# Patient Record
Sex: Male | Born: 1945 | Race: White | Hispanic: No | Marital: Married | Smoking: Current every day smoker
Health system: Southern US, Community
[De-identification: ages and names within clinical notes are randomized; demographics above are authoritative.]

## PROBLEM LIST (undated history)

## (undated) DIAGNOSIS — C61 Malignant neoplasm of prostate: Secondary | ICD-10-CM

## (undated) DIAGNOSIS — I739 Peripheral vascular disease, unspecified: Secondary | ICD-10-CM

## (undated) DIAGNOSIS — I10 Essential (primary) hypertension: Secondary | ICD-10-CM

## (undated) DIAGNOSIS — C4490 Unspecified malignant neoplasm of skin, unspecified: Secondary | ICD-10-CM

## (undated) HISTORY — PX: BACK SURGERY: SHX140

## (undated) HISTORY — DX: Malignant neoplasm of prostate: C61

## (undated) HISTORY — PX: MOHS SURGERY: SUR867

## (undated) HISTORY — PX: EYE SURGERY: SHX253

---

## 2004-12-01 ENCOUNTER — Other Ambulatory Visit: Payer: Self-pay

## 2004-12-01 ENCOUNTER — Emergency Department: Payer: Self-pay | Admitting: Emergency Medicine

## 2004-12-12 ENCOUNTER — Ambulatory Visit: Payer: Self-pay | Admitting: Internal Medicine

## 2007-07-17 ENCOUNTER — Ambulatory Visit: Payer: Self-pay | Admitting: Otolaryngology

## 2012-03-25 DIAGNOSIS — M62838 Other muscle spasm: Secondary | ICD-10-CM | POA: Diagnosis not present

## 2012-03-25 DIAGNOSIS — M999 Biomechanical lesion, unspecified: Secondary | ICD-10-CM | POA: Diagnosis not present

## 2012-03-25 DIAGNOSIS — M5030 Other cervical disc degeneration, unspecified cervical region: Secondary | ICD-10-CM | POA: Diagnosis not present

## 2012-03-25 DIAGNOSIS — M9981 Other biomechanical lesions of cervical region: Secondary | ICD-10-CM | POA: Diagnosis not present

## 2012-03-31 DIAGNOSIS — M62838 Other muscle spasm: Secondary | ICD-10-CM | POA: Diagnosis not present

## 2012-03-31 DIAGNOSIS — M999 Biomechanical lesion, unspecified: Secondary | ICD-10-CM | POA: Diagnosis not present

## 2012-03-31 DIAGNOSIS — M9981 Other biomechanical lesions of cervical region: Secondary | ICD-10-CM | POA: Diagnosis not present

## 2012-03-31 DIAGNOSIS — M5030 Other cervical disc degeneration, unspecified cervical region: Secondary | ICD-10-CM | POA: Diagnosis not present

## 2012-04-08 DIAGNOSIS — M999 Biomechanical lesion, unspecified: Secondary | ICD-10-CM | POA: Diagnosis not present

## 2012-04-08 DIAGNOSIS — M9981 Other biomechanical lesions of cervical region: Secondary | ICD-10-CM | POA: Diagnosis not present

## 2012-04-08 DIAGNOSIS — M5030 Other cervical disc degeneration, unspecified cervical region: Secondary | ICD-10-CM | POA: Diagnosis not present

## 2012-04-08 DIAGNOSIS — M62838 Other muscle spasm: Secondary | ICD-10-CM | POA: Diagnosis not present

## 2012-05-06 DIAGNOSIS — M999 Biomechanical lesion, unspecified: Secondary | ICD-10-CM | POA: Diagnosis not present

## 2012-05-06 DIAGNOSIS — M62838 Other muscle spasm: Secondary | ICD-10-CM | POA: Diagnosis not present

## 2012-06-03 DIAGNOSIS — M999 Biomechanical lesion, unspecified: Secondary | ICD-10-CM | POA: Diagnosis not present

## 2012-06-03 DIAGNOSIS — M62838 Other muscle spasm: Secondary | ICD-10-CM | POA: Diagnosis not present

## 2012-07-08 DIAGNOSIS — M999 Biomechanical lesion, unspecified: Secondary | ICD-10-CM | POA: Diagnosis not present

## 2012-07-08 DIAGNOSIS — M62838 Other muscle spasm: Secondary | ICD-10-CM | POA: Diagnosis not present

## 2012-08-12 DIAGNOSIS — M62838 Other muscle spasm: Secondary | ICD-10-CM | POA: Diagnosis not present

## 2012-08-12 DIAGNOSIS — M999 Biomechanical lesion, unspecified: Secondary | ICD-10-CM | POA: Diagnosis not present

## 2012-08-12 DIAGNOSIS — M436 Torticollis: Secondary | ICD-10-CM | POA: Diagnosis not present

## 2012-08-12 DIAGNOSIS — M9981 Other biomechanical lesions of cervical region: Secondary | ICD-10-CM | POA: Diagnosis not present

## 2013-01-31 ENCOUNTER — Ambulatory Visit: Payer: Self-pay | Admitting: Internal Medicine

## 2013-01-31 DIAGNOSIS — M25569 Pain in unspecified knee: Secondary | ICD-10-CM | POA: Diagnosis not present

## 2013-01-31 DIAGNOSIS — M109 Gout, unspecified: Secondary | ICD-10-CM | POA: Diagnosis not present

## 2013-01-31 DIAGNOSIS — M7040 Prepatellar bursitis, unspecified knee: Secondary | ICD-10-CM | POA: Diagnosis not present

## 2013-01-31 DIAGNOSIS — M7989 Other specified soft tissue disorders: Secondary | ICD-10-CM | POA: Diagnosis not present

## 2013-01-31 DIAGNOSIS — M25469 Effusion, unspecified knee: Secondary | ICD-10-CM | POA: Diagnosis not present

## 2013-01-31 LAB — SEDIMENTATION RATE: Erythrocyte Sed Rate: 54 mm/hr — ABNORMAL HIGH (ref 0–20)

## 2013-01-31 LAB — SYNOVIAL CELL COUNT + DIFF, W/ CRYSTALS
Basophil: 0 %
Crystals, Joint Fluid: NONE SEEN
Lymphocytes: 2 %
Nucleated Cell Count: 23114 /mm3
Other Cells BF: 0 %
Other Mononuclear Cells: 0 %

## 2013-01-31 LAB — CBC WITH DIFFERENTIAL/PLATELET
Eosinophil #: 0.1 10*3/uL (ref 0.0–0.7)
HCT: 44.3 % (ref 40.0–52.0)
MCH: 31.9 pg (ref 26.0–34.0)
MCHC: 34.3 g/dL (ref 32.0–36.0)
MCV: 93 fL (ref 80–100)
Monocyte %: 10.8 %
Platelet: 190 10*3/uL (ref 150–440)
RBC: 4.78 10*6/uL (ref 4.40–5.90)
WBC: 13.1 10*3/uL — ABNORMAL HIGH (ref 3.8–10.6)

## 2013-02-04 DIAGNOSIS — M009 Pyogenic arthritis, unspecified: Secondary | ICD-10-CM | POA: Diagnosis not present

## 2013-02-04 DIAGNOSIS — M76899 Other specified enthesopathies of unspecified lower limb, excluding foot: Secondary | ICD-10-CM | POA: Diagnosis not present

## 2013-02-04 DIAGNOSIS — L02419 Cutaneous abscess of limb, unspecified: Secondary | ICD-10-CM | POA: Diagnosis not present

## 2013-02-04 LAB — BODY FLUID CULTURE

## 2013-02-06 DIAGNOSIS — L02419 Cutaneous abscess of limb, unspecified: Secondary | ICD-10-CM | POA: Diagnosis not present

## 2013-02-09 DIAGNOSIS — M76899 Other specified enthesopathies of unspecified lower limb, excluding foot: Secondary | ICD-10-CM | POA: Diagnosis not present

## 2013-02-09 DIAGNOSIS — L02419 Cutaneous abscess of limb, unspecified: Secondary | ICD-10-CM | POA: Diagnosis not present

## 2013-02-13 DIAGNOSIS — M76899 Other specified enthesopathies of unspecified lower limb, excluding foot: Secondary | ICD-10-CM | POA: Diagnosis not present

## 2013-02-13 DIAGNOSIS — L02419 Cutaneous abscess of limb, unspecified: Secondary | ICD-10-CM | POA: Diagnosis not present

## 2013-02-19 DIAGNOSIS — L02419 Cutaneous abscess of limb, unspecified: Secondary | ICD-10-CM | POA: Diagnosis not present

## 2013-04-30 HISTORY — PX: OTHER SURGICAL HISTORY: SHX169

## 2013-06-14 ENCOUNTER — Ambulatory Visit: Payer: Self-pay | Admitting: Family Medicine

## 2013-06-14 DIAGNOSIS — J441 Chronic obstructive pulmonary disease with (acute) exacerbation: Secondary | ICD-10-CM | POA: Diagnosis not present

## 2013-06-14 DIAGNOSIS — J40 Bronchitis, not specified as acute or chronic: Secondary | ICD-10-CM | POA: Diagnosis not present

## 2013-06-14 DIAGNOSIS — J329 Chronic sinusitis, unspecified: Secondary | ICD-10-CM | POA: Diagnosis not present

## 2013-06-14 DIAGNOSIS — J069 Acute upper respiratory infection, unspecified: Secondary | ICD-10-CM | POA: Diagnosis not present

## 2013-06-14 DIAGNOSIS — F17200 Nicotine dependence, unspecified, uncomplicated: Secondary | ICD-10-CM | POA: Diagnosis not present

## 2013-12-16 DIAGNOSIS — D485 Neoplasm of uncertain behavior of skin: Secondary | ICD-10-CM | POA: Diagnosis not present

## 2013-12-16 DIAGNOSIS — L821 Other seborrheic keratosis: Secondary | ICD-10-CM | POA: Diagnosis not present

## 2013-12-16 DIAGNOSIS — C4441 Basal cell carcinoma of skin of scalp and neck: Secondary | ICD-10-CM | POA: Diagnosis not present

## 2013-12-16 DIAGNOSIS — C44319 Basal cell carcinoma of skin of other parts of face: Secondary | ICD-10-CM | POA: Diagnosis not present

## 2013-12-16 DIAGNOSIS — L570 Actinic keratosis: Secondary | ICD-10-CM | POA: Diagnosis not present

## 2013-12-16 DIAGNOSIS — Z1283 Encounter for screening for malignant neoplasm of skin: Secondary | ICD-10-CM | POA: Diagnosis not present

## 2013-12-16 DIAGNOSIS — C44519 Basal cell carcinoma of skin of other part of trunk: Secondary | ICD-10-CM | POA: Diagnosis not present

## 2014-02-10 DIAGNOSIS — C44519 Basal cell carcinoma of skin of other part of trunk: Secondary | ICD-10-CM | POA: Diagnosis not present

## 2014-02-10 DIAGNOSIS — C4441 Basal cell carcinoma of skin of scalp and neck: Secondary | ICD-10-CM | POA: Diagnosis not present

## 2014-02-24 DIAGNOSIS — C44319 Basal cell carcinoma of skin of other parts of face: Secondary | ICD-10-CM | POA: Diagnosis not present

## 2014-02-24 DIAGNOSIS — C44519 Basal cell carcinoma of skin of other part of trunk: Secondary | ICD-10-CM | POA: Diagnosis not present

## 2014-06-22 DIAGNOSIS — L570 Actinic keratosis: Secondary | ICD-10-CM | POA: Diagnosis not present

## 2014-06-22 DIAGNOSIS — Z1283 Encounter for screening for malignant neoplasm of skin: Secondary | ICD-10-CM | POA: Diagnosis not present

## 2014-06-22 DIAGNOSIS — Z85828 Personal history of other malignant neoplasm of skin: Secondary | ICD-10-CM | POA: Diagnosis not present

## 2014-06-22 DIAGNOSIS — D485 Neoplasm of uncertain behavior of skin: Secondary | ICD-10-CM | POA: Diagnosis not present

## 2014-06-22 DIAGNOSIS — Z08 Encounter for follow-up examination after completed treatment for malignant neoplasm: Secondary | ICD-10-CM | POA: Diagnosis not present

## 2014-07-14 ENCOUNTER — Ambulatory Visit: Payer: Self-pay | Admitting: Emergency Medicine

## 2014-07-14 DIAGNOSIS — J329 Chronic sinusitis, unspecified: Secondary | ICD-10-CM | POA: Diagnosis not present

## 2014-07-14 DIAGNOSIS — F17210 Nicotine dependence, cigarettes, uncomplicated: Secondary | ICD-10-CM | POA: Diagnosis not present

## 2014-07-14 DIAGNOSIS — J069 Acute upper respiratory infection, unspecified: Secondary | ICD-10-CM | POA: Diagnosis not present

## 2014-09-14 DIAGNOSIS — L570 Actinic keratosis: Secondary | ICD-10-CM | POA: Diagnosis not present

## 2014-09-14 DIAGNOSIS — D485 Neoplasm of uncertain behavior of skin: Secondary | ICD-10-CM | POA: Diagnosis not present

## 2014-09-15 DIAGNOSIS — D0339 Melanoma in situ of other parts of face: Secondary | ICD-10-CM | POA: Diagnosis not present

## 2014-10-19 DIAGNOSIS — R238 Other skin changes: Secondary | ICD-10-CM | POA: Diagnosis not present

## 2014-10-19 DIAGNOSIS — D0339 Melanoma in situ of other parts of face: Secondary | ICD-10-CM | POA: Diagnosis not present

## 2014-10-19 DIAGNOSIS — Z85820 Personal history of malignant melanoma of skin: Secondary | ICD-10-CM | POA: Insufficient documentation

## 2015-02-22 ENCOUNTER — Emergency Department
Admission: EM | Admit: 2015-02-22 | Discharge: 2015-02-22 | Disposition: A | Discharge status: Home / Self Care | Payer: Worker's Compensation | Attending: Emergency Medicine | Admitting: Emergency Medicine

## 2015-02-22 ENCOUNTER — Emergency Department: Payer: Worker's Compensation

## 2015-02-22 ENCOUNTER — Encounter: Payer: Self-pay | Admitting: Emergency Medicine

## 2015-02-22 ENCOUNTER — Encounter: Payer: Self-pay | Admitting: Intensive Care

## 2015-02-22 ENCOUNTER — Ambulatory Visit
Admission: EM | Admit: 2015-02-22 | Discharge: 2015-02-22 | Disposition: A | Discharge status: Home / Self Care | Payer: Worker's Compensation | Attending: Family Medicine | Admitting: Family Medicine

## 2015-02-22 DIAGNOSIS — W19XXXA Unspecified fall, initial encounter: Secondary | ICD-10-CM | POA: Diagnosis not present

## 2015-02-22 DIAGNOSIS — Y990 Civilian activity done for income or pay: Secondary | ICD-10-CM | POA: Diagnosis not present

## 2015-02-22 DIAGNOSIS — M79604 Pain in right leg: Secondary | ICD-10-CM | POA: Diagnosis not present

## 2015-02-22 DIAGNOSIS — Y9289 Other specified places as the place of occurrence of the external cause: Secondary | ICD-10-CM | POA: Insufficient documentation

## 2015-02-22 DIAGNOSIS — S81811A Laceration without foreign body, right lower leg, initial encounter: Secondary | ICD-10-CM | POA: Insufficient documentation

## 2015-02-22 DIAGNOSIS — W1789XA Other fall from one level to another, initial encounter: Secondary | ICD-10-CM | POA: Insufficient documentation

## 2015-02-22 DIAGNOSIS — S0003XA Contusion of scalp, initial encounter: Secondary | ICD-10-CM | POA: Diagnosis not present

## 2015-02-22 DIAGNOSIS — Y9389 Activity, other specified: Secondary | ICD-10-CM | POA: Diagnosis not present

## 2015-02-22 DIAGNOSIS — Z720 Tobacco use: Secondary | ICD-10-CM | POA: Diagnosis not present

## 2015-02-22 DIAGNOSIS — IMO0002 Reserved for concepts with insufficient information to code with codable children: Secondary | ICD-10-CM

## 2015-02-22 DIAGNOSIS — Z23 Encounter for immunization: Secondary | ICD-10-CM | POA: Insufficient documentation

## 2015-02-22 HISTORY — DX: Unspecified malignant neoplasm of skin, unspecified: C44.90

## 2015-02-22 MED ORDER — CEPHALEXIN 250 MG PO CAPS: 250.0000 mg | ORAL_CAPSULE | Freq: Four times a day (QID) | ORAL | Status: AC

## 2015-02-22 MED ORDER — TETANUS-DIPHTH-ACELL PERTUSSIS 5-2.5-18.5 LF-MCG/0.5 IM SUSP
0.5000 mL | Freq: Once | INTRAMUSCULAR | Status: AC
  Administered 2015-02-22: 0.5 mL via INTRAMUSCULAR
  Filled 2015-02-22: qty 0.5

## 2015-02-22 MED ORDER — LIDOCAINE-EPINEPHRINE (PF) 1 %-1:200000 IJ SOLN
INTRAMUSCULAR | Status: AC
  Administered 2015-02-22: 30 mL
  Filled 2015-02-22: qty 30

## 2015-02-22 MED ORDER — OXYCODONE-ACETAMINOPHEN 5-325 MG PO TABS: 2.0000 | ORAL_TABLET | Freq: Four times a day (QID) | ORAL | Status: DC | PRN

## 2015-02-22 NOTE — ED Notes (Signed)
EMS present to transport pt. Pt remains stable, alert and talkative. Wife present, aware pt is going to [facility].

## 2015-02-22 NOTE — ED Provider Notes (Signed)
[facility] Emergency Department Provider Note     Time seen: ----------------------------------------- 9:25 AM on 02/22/2015 -----------------------------------------    I have reviewed the triage vital signs and the nursing notes.   HISTORY  Chief Complaint Fall    HPI Howard Stafford is a 69 y.o. male who presents ER arriving by EMS from work. Patient fell 6 feet from some scaffolding. He was noted to have extensive lacerations and tissue damage to his right leg. He was sent to ER for excessive bleeding at the wound site. He also hit his head, but denies loss of consciousness. He complains of mild pain in the right lower extremity about 3 of 10. Nothing makes it better or worse.   Past Medical History  Diagnosis Date  . Skin cancer face    There are no active problems to display for this patient.   Past Surgical History  Procedure Laterality Date  . Back surgery      Allergies Review of patient's allergies indicates no known allergies.  Social History Social History  Substance Use Topics  . Smoking status: Current Every Day Smoker    Types: Cigarettes  . Smokeless tobacco: Never Used  . Alcohol Use: Yes     Comment: Everynight [name]    Review of Systems Constitutional: Negative for fever. Genitourinary: Negative for dysuria. Musculoskeletal: Positive for right lower extremity pain Skin: Positive for extensive right lower extremity laceration Neurological: Negative for headaches, focal weakness or numbness.  10-point ROS otherwise negative.  ____________________________________________   PHYSICAL EXAM:  VITAL SIGNS: ED Triage Vitals  Enc Vitals Group     BP 02/22/15 0857 159/100 mmHg     Pulse Rate 02/22/15 0857 70     Resp 02/22/15 0859 20     Temp 02/22/15 0859 98.1 F (36.7 C)     Temp Source 02/22/15 0859 Oral     SpO2 02/22/15 0857 96 %     Weight 02/22/15 0900 195 lb (88.451 kg)     Height 02/22/15  0900 5\' 10"  (1.778 m)     Head Cir --      Peak Flow --      Pain Score 02/22/15 0901 3     Pain Loc --      Pain Edu? --      Excl. in [place]? --     Constitutional: Alert and oriented. Well appearing and in no distress. Eyes: Conjunctivae are normal. PERRL. Normal extraocular movements. ENT   Head: Posterior scalp contusion   Nose: No congestion/rhinnorhea.   Mouth/Throat: Mucous membranes are moist.   Neck: No stridor. Musculoskeletal: Extensive right lower extremity lacerations, soft tissue injury Neurologic:  Normal speech and language. No gross focal neurologic deficits are appreciated. Speech is normal. No gait instability. Normal sensation and motor function the right lower extremity Skin:  Two large V-shaped lacerations below the knee on the right lower extremity Psychiatric: Mood and affect are normal. Speech and behavior are normal. Patient exhibits appropriate insight and judgment. ____________________________________________  ED COURSE:  Pertinent labs & imaging results that were available during my care of the patient were reviewed by me and considered in my medical decision making (see chart for details). Patient receive imaging of the right lower extremity as well as CT head. Patient has extensive lacerations that require repair  LACERATION REPAIR Performed by: Earleen Newport Authorized by: Lenise Arena E Consent: Verbal consent obtained. Risks and benefits: risks, benefits and alternatives were discussed Consent given by:  patient Patient identity confirmed: provided demographic data Prepped and Draped in normal sterile fashion Wound explored without foreign body identified  Laceration Location: Right lower extremity  Laceration Length: 25 cm   No Foreign Bodies seen or palpated  Anesthesia: local infiltration  Local anesthetic: lidocaine 1 % with epinephrine  Anesthetic total: 10 ml  Irrigation method: syringe Amount of cleaning:  standard  Skin closure: The lateral aspect of both V-shaped wounds were closed with a horizontal 3-0 Ethilon mattress stitch. Making a total of 4 of these sutures. The tip of the lacerations was also closed with 3-0 Ethilon. The remainder of both wounds were closed with staples. The superior wound was closed with 15 staples, the inferior wound was closed with 10 staples Number of sutures: 6   Technique: Mattress stitch, standard interrupted   Patient tolerance: Patient tolerated the procedure well with no immediate complications. ____________________________________________   RADIOLOGY Images were viewed by me  Patient declined CT imaging of the head, tib-fib x-rays are unremarkable except for soft tissue injury  ____________________________________________  FINAL ASSESSMENT AND PLAN  Fall, lacerations  Plan: Patient with labs and imaging as dictated above. Wounds were repaired as dictated above. Patient will follow-up in 1-2 days for recheck. Will discharge with Keflex and pain medication. Patient did receive a TDAP here prior to discharge   Earleen Newport, MD   Earleen Newport, MD 02/22/15 1020

## 2015-02-22 NOTE — ED Notes (Signed)
Patient states that he fell at work 6 ft.  Patient has deep laceration to right leg.

## 2015-02-22 NOTE — ED Notes (Signed)
Patient presents ambulatory to front desk with blood soaked towel wrapped around his right leg and secured with electrical tape. Pt's boot and and jeans saturated with blood. Pt verbalized "I fell from a 78ft scaffold and lacerated my leg. I have some bleeding from the back of my head, and from the back of my hand." Pt denies LOC. Assisted to exam room. Jeans cut off, towel removed and pressure dressing applied. Provider at bedside. EMS called for transport as laceration to RLE exposes bone. Vital signs stable.

## 2015-02-22 NOTE — ED Notes (Addendum)
PAtient arrived by EMS from work. PAtient fell 6 ft while scaffolding. R shin has two lacerations. Blood drainage noted

## 2015-02-22 NOTE — ED Provider Notes (Addendum)
CSN: [id number]     Arrival date & time 02/22/15  0810 History   First MD Initiated Contact with Patient 02/22/15 [phone]     Chief Complaint  Patient presents with  . Extremity Laceration  . Fall   (Consider location/radiation/quality/duration/timing/severity/associated sxs/prior Treatment) HPI  This a 69 year old male presents with a right lower leg lacerations that occurred when he was at work and fell 6 feet from scaffolding. He has no loss of consciousness and denies any visual disturbances weakness numbness or tingling. His lower leg is wrapped in a towel that is soaked with blood. In addition he has a skin tear over his right hand dorsum and an abrasion on the back of his head with no apparent laceration. Is no cervical or spinal tenderness. Is brought in by his coworkers.       History reviewed. No pertinent past medical history. History reviewed. No pertinent past surgical history. History reviewed. No pertinent family history. Social History  Substance Use Topics  . Smoking status: Current Every Day Smoker -- 1.00 packs/day    Types: Cigarettes  . Smokeless tobacco: None  . Alcohol Use: Yes    Review of Systems  Constitutional: Negative for fever, chills, diaphoresis and fatigue.  HENT: Negative for ear discharge, ear pain, facial swelling and hearing loss.   Eyes: Negative for photophobia and pain.  Respiratory: Negative for cough.   Neurological: Negative for dizziness, facial asymmetry, speech difficulty, weakness, numbness and headaches.  All other systems reviewed and are negative.   Allergies  Review of patient's allergies indicates no known allergies.  Home Medications   Prior to Admission medications   Medication Sig Start Date End Date Taking? Authorizing Provider  aspirin 325 MG EC tablet Take 325 mg by mouth daily.   Yes Historical Provider, MD   Meds Ordered and Administered this Visit  Medications - No data to display  BP 185/89 mmHg  Pulse 74   Resp 17  SpO2 99% No data found.   Physical Exam  Constitutional: He is oriented to person, place, and time. He appears well-developed and well-nourished.  HENT:  Head: Normocephalic.  Semination of his hospitalization shows a abrasion to 3 cm in length on the midline occipital region. No pain with cervical motion. No tenderness to palpation.  Neck: Neck supple.  Musculoskeletal:  Examination of the right lower extremity shows 2 flap type lacerations over the anterior tibia; distal one is the most severe with exposed. periostium. The more proximal laceration has some mild oozing bleeding but no active bleeding is seen from either laceration. Distal pulses are intact both the dorsalis pedis and posterior tibialis.Distal sensation is intact to light touch. Motors are intact and strong. There is no obvious deformity present. There is a skin tear over the right dorsum hand.  Neurological: He is alert and oriented to person, place, and time. He exhibits normal muscle tone. Coordination normal.  Skin: Skin is warm and dry.  Psychiatric: He has a normal mood and affect. His behavior is normal. Judgment and thought content normal.  Nursing note and vitals reviewed.   ED Course  Procedures (including critical care time)  Labs Review Labs Reviewed - No data to display  Imaging Review No results found.   Visual Acuity Review  Right Eye Distance:   Left Eye Distance:   Bilateral Distance:    Right Eye Near:   Left Eye Near:    Bilateral Near:         MDM  1. Lacerations of multiple sites of right leg, initial encounter    The bloody wraps were removed the wound evaluated and new dressings of dry sterile dressings applied. A IV was placed with Hep-Lock in the  left upper extremity. Patient was stable and will be transported to [facility] emergency department for further evaluation and care. Colletta Maryland Camera operator at [facility] was notified.    Lorin Picket, PA-C 02/22/15  0859  Lorin Picket, PA-C 02/22/15 [phone]

## 2015-02-22 NOTE — Discharge Instructions (Signed)

## 2015-02-23 ENCOUNTER — Emergency Department
Admission: EM | Admit: 2015-02-23 | Discharge: 2015-02-23 | Disposition: A | Discharge status: Home / Self Care | Payer: Worker's Compensation | Attending: Emergency Medicine | Admitting: Emergency Medicine

## 2015-02-23 ENCOUNTER — Encounter: Payer: Self-pay | Admitting: Intensive Care

## 2015-02-23 ENCOUNTER — Telehealth: Payer: Self-pay

## 2015-02-23 DIAGNOSIS — Z4801 Encounter for change or removal of surgical wound dressing: Secondary | ICD-10-CM | POA: Insufficient documentation

## 2015-02-23 DIAGNOSIS — M791 Myalgia: Secondary | ICD-10-CM | POA: Diagnosis not present

## 2015-02-23 DIAGNOSIS — Z720 Tobacco use: Secondary | ICD-10-CM | POA: Insufficient documentation

## 2015-02-23 DIAGNOSIS — Z7982 Long term (current) use of aspirin: Secondary | ICD-10-CM | POA: Insufficient documentation

## 2015-02-23 DIAGNOSIS — Z792 Long term (current) use of antibiotics: Secondary | ICD-10-CM | POA: Diagnosis not present

## 2015-02-23 DIAGNOSIS — IMO0002 Reserved for concepts with insufficient information to code with codable children: Secondary | ICD-10-CM

## 2015-02-23 NOTE — Discharge Instructions (Signed)
Continue previous medications

## 2015-02-23 NOTE — ED Provider Notes (Signed)
[facility] Emergency Department Provider Note ____________________________________________  Time seen: Approximately 10:42 AM  I have reviewed the triage vital signs and the nursing notes.   HISTORY  Chief Complaint Wound Check  HPI Howard Stafford is a 69 y.o. male who presents today for a wound check of 2 L lower leg lacerations that he received treatment for at this ED yesterday. Pt states that he has developed more generalized soreness since yesterday but no acute pain. He has not changed the dressing over the lacerations since yesterday. Denies fever, chills or night sweats.    Past Medical History  Diagnosis Date  . Skin cancer face    There are no active problems to display for this patient.   Past Surgical History  Procedure Laterality Date  . Back surgery      Current Outpatient Rx  Name  Route  Sig  Dispense  Refill  . aspirin 325 MG EC tablet   Oral   Take 325 mg by mouth daily.         . cephALEXin (KEFLEX) 250 MG capsule   Oral   Take 1 capsule (250 mg total) by mouth 4 (four) times daily.   40 capsule   0   . oxyCODONE-acetaminophen (PERCOCET) 5-325 MG tablet   Oral   Take 2 tablets by mouth every 6 (six) hours as needed for moderate pain or severe pain.   30 tablet   0     Allergies Review of patient's allergies indicates no known allergies.  History reviewed. No pertinent family history.  Social History Social History  Substance Use Topics  . Smoking status: Current Every Day Smoker -- 1.00 packs/day    Types: Cigarettes  . Smokeless tobacco: Never Used  . Alcohol Use: Yes     Comment: Everynight [name]    Review of Systems Constitutional: No fever/chills/night sweats Cardiovascular: Denies chest pain. Respiratory: Denies shortness of breath. Gastrointestinal: No abdominal pain. Musculoskeletal: Positive for generalized body aches and pain to laceration sites. Skin: two V shaped lacerations to  anterior of L lower leg 10-point ROS otherwise negative.  ____________________________________________   PHYSICAL EXAM:  VITAL SIGNS: ED Triage Vitals  Enc Vitals Group     BP 02/23/15 1007 161/74 mmHg     Pulse Rate 02/23/15 1007 80     Resp 02/23/15 1007 16     Temp 02/23/15 1007 97.5 F (36.4 C)     Temp Source 02/23/15 1007 Oral     SpO2 02/23/15 1007 98 %     Weight 02/23/15 1007 195 lb (88.451 kg)     Height 02/23/15 1007 5\' 11"  (1.803 m)     Head Cir --      Peak Flow --      Pain Score 02/23/15 1008 4     Pain Loc --      Pain Edu? --      Excl. in [place]? --    Constitutional: Alert and oriented. Well appearing and in no acute distress. Eyes: Conjunctivae are normal. Head: Atraumatic. Mouth/Throat: Mucous membranes are moist.   Cardiovascular: Normal rate, regular rhythm. Grossly normal heart sounds.  Good peripheral circulation. Respiratory: Normal respiratory effort.  No retractions. Gastrointestinal: Soft and nontender. No distention.  Musculoskeletal: Well healing V shaped lacerations to R anterior lower leg. No erythema, streaking, discharge or warmth to the wounds. Minimal bleeding noted. Neurologic:  Normal speech and language. No gross focal neurologic deficits are appreciated. No gait instability. Skin:  See musculoskeletal for further details regarding wounds. Skin is warm and dry. No rash noted. Psychiatric: Mood and affect are normal. Speech and behavior are normal.  PROCEDURES  Procedure(s) performed: None  Critical Care performed: No  ____________________________________________   INITIAL IMPRESSION / ASSESSMENT AND PLAN / ED COURSE  Pertinent labs & imaging results that were available during my care of the patient were reviewed by me and considered in my medical decision making (see chart for details).  69 yo M who presents for a wound check of 2 large V shaped lacerations to his R shin that he received care for in this ED yesterday. He states  he has had no complaints since yesterday other than generalized soreness from the fall. His dressing was changed in the ED today and the wound appears to be healing well with no signs of infection. Pt was advised to continue to change the dressing daily for the next few days and then change it as needed after that. Follow up in 6-9 days for staple and suture removal. 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 Pt and wife both verbalized their understanding of these instructions.  ____________________________________________   FINAL CLINICAL IMPRESSION(S) / ED DIAGNOSES  Final diagnoses:  Encounter for re-check of laceration wound      Howard Feil, PA-C 02/23/15 [place], MD 02/23/15 1555

## 2015-02-23 NOTE — ED Notes (Signed)
Call to follow up on patient. He was transported from [facility] to ED yesterday morning. Mr. Kneisel states ED doctor sewed up his leg and he is going back this morning for a wound check. He states he had a good night, taking antibiotic and pain medication. He expressed great gratitude for care given at Urgent Care and he appreciates the pressure dressing and the quick EMS transport port to the ED.   It is noted that the patient has 2 MRN numbers ([id number]), and this chart.

## 2015-02-23 NOTE — ED Notes (Signed)
Pt alert and oriented X4, active, cooperative, pt in NAD. RR even and unlabored, color WNL.  Pt informed to return if any life threatening symptoms occur.   

## 2015-02-23 NOTE — ED Notes (Signed)
Patient had accident yesterday requiring sutures to right lower leg.  Per patient the doctor told him to come back today to have wound evaluated.

## 2015-03-04 ENCOUNTER — Encounter: Payer: Self-pay | Admitting: *Deleted

## 2015-03-04 ENCOUNTER — Emergency Department
Admission: EM | Admit: 2015-03-04 | Discharge: 2015-03-04 | Disposition: A | Discharge status: Home / Self Care | Payer: Worker's Compensation | Attending: Emergency Medicine | Admitting: Emergency Medicine

## 2015-03-04 DIAGNOSIS — Z4801 Encounter for change or removal of surgical wound dressing: Secondary | ICD-10-CM | POA: Insufficient documentation

## 2015-03-04 DIAGNOSIS — Z7982 Long term (current) use of aspirin: Secondary | ICD-10-CM | POA: Insufficient documentation

## 2015-03-04 DIAGNOSIS — Z792 Long term (current) use of antibiotics: Secondary | ICD-10-CM | POA: Diagnosis not present

## 2015-03-04 DIAGNOSIS — Z720 Tobacco use: Secondary | ICD-10-CM | POA: Insufficient documentation

## 2015-03-04 DIAGNOSIS — Z4802 Encounter for removal of sutures: Secondary | ICD-10-CM | POA: Insufficient documentation

## 2015-03-04 DIAGNOSIS — IMO0002 Reserved for concepts with insufficient information to code with codable children: Secondary | ICD-10-CM

## 2015-03-04 DIAGNOSIS — Z5189 Encounter for other specified aftercare: Secondary | ICD-10-CM

## 2015-03-04 MED ORDER — BACITRACIN ZINC 500 UNIT/GM EX OINT
TOPICAL_OINTMENT | Freq: Two times a day (BID) | CUTANEOUS | Status: DC
  Filled 2015-03-04: qty 0.9

## 2015-03-04 NOTE — ED Notes (Signed)
Staples intact to right lower leg lacerations. Swelling noted to right lower leg. Positive pedal pulses.

## 2015-03-04 NOTE — ED Notes (Signed)
Bacitracin applied to wound. Sterile dressing applied.

## 2015-03-04 NOTE — Discharge Instructions (Signed)
Keep area clean. Clean daily with soap and water. Apply topical antibiotic ointment such as Neosporin. Keep covered. Allow some open air times daily in clean environment.   Return here in 5 days for wound check. Return sooner for drainage, swelling, redness, pain, new or worsening concerns.

## 2015-03-04 NOTE — ED Provider Notes (Signed)
[facility] Emergency Department Provider Note  ____________________________________________  Time seen: Approximately 12:33 PM  I have reviewed the triage vital signs and the nursing notes.   HISTORY  Chief Complaint Suture / Staple Removal   HPI Howard Stafford is a 69 y.o. male  Presents for complaints of wound check and suture/staple removal. States he was seen in ER on 02/22/2015 for laceration repair. 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 Patient reports that on 02/22/2015 he was at work and fell from a 6 foot scaffold which caused a laceration to right anterior leg. Patient states he denies current pain. Patient states that he feels well and denies pain. States he feels like the laceration has been healing well. Denies drainage, fever, redness, calf pain, chest pain, shortness of breath or weakness. States the skin around laceration itches at times and minimal TTP, but denies pain. States he has been taking oral antibiotic as prescribed and tolerating well. States has not needed to take other pain medication. States tetanus immunization is up to date.    Past Medical History  Diagnosis Date  . Skin cancer face    There are no active problems to display for this patient.   Past Surgical History  Procedure Laterality Date  . Back surgery      Current Outpatient Rx  Name  Route  Sig  Dispense  Refill  . aspirin 325 MG EC tablet   Oral   Take 325 mg by mouth daily.         . cephALEXin (KEFLEX) 250 MG capsule   Oral   Take 1 capsule (250 mg total) by mouth 4 (four) times daily.   40 capsule   0   . oxyCODONE-acetaminophen (PERCOCET) 5-325 MG tablet   Oral   Take 2 tablets by mouth every 6 (six) hours as needed for moderate pain or severe pain.   30 tablet   0     Allergies Review of patient's allergies indicates no known allergies.  No family history on file.  Social History Social History  Substance Use  Topics  . Smoking status: Current Every Day Smoker -- 1.00 packs/day    Types: Cigarettes  . Smokeless tobacco: Never Used  . Alcohol Use: Yes     Comment: Everynight [name]    Review of Systems Constitutional: No fever/chills Eyes: No visual changes. ENT: No sore throat. Cardiovascular: Denies chest pain. Respiratory: Denies shortness of breath. Gastrointestinal: No abdominal pain.  No nausea, no vomiting.  No diarrhea.  No constipation. Genitourinary: Negative for dysuria. Musculoskeletal: Negative for back pain. Skin: Negative for rash. Right lower leg wound.  Neurological: Negative for headaches, focal weakness or numbness.  10-point ROS otherwise negative.  ____________________________________________   PHYSICAL EXAM:  VITAL SIGNS: ED Triage Vitals  Enc Vitals Group     BP 03/04/15 0912 189/90 mmHg     Pulse Rate 03/04/15 0912 77     Resp 03/04/15 0912 18     Temp 03/04/15 0912 97.7 F (36.5 C)     Temp Source 03/04/15 0912 Oral     SpO2 03/04/15 0912 98 %     Weight 03/04/15 0912 195 lb (88.451 kg)     Height 03/04/15 0912 5\' 11"  (1.803 m)     Head Cir --      Peak Flow --      Pain Score --      Pain Loc --      Pain  Edu? --      Excl. in [place]? --    Today's Vitals   03/04/15 0912 03/04/15 1257  BP: 189/90 139/82  Pulse: 77   Temp: 97.7 F (36.5 C)   TempSrc: Oral   Resp: 18   Height: 5\' 11"  (1.803 m)   Weight: 195 lb (88.451 kg)   SpO2: 98%       Constitutional: Alert and oriented. Well appearing and in no acute distress. Eyes: Conjunctivae are normal. PERRL. EOMI. Head: Atraumatic.  Nose: No congestion/rhinnorhea.  Mouth/Throat: Mucous membranes are moist.  Neck: No stridor.  No cervical spine tenderness to palpation. Hematological/Lymphatic/Immunilogical: No cervical lymphadenopathy. Cardiovascular: Normal rate, regular rhythm. Grossly normal heart sounds.  Good peripheral circulation. Respiratory: Normal respiratory effort.  No  retractions. Lungs CTAB. Gastrointestinal: Soft and nontender. No distention. Normal Bowel sounds.   Musculoskeletal: No lower or upper extremity tenderness nor edema.  Bilateral pedal pulses equal and easily palpated.  Neurologic:  Normal speech and language. No gross focal neurologic deficits are appreciated. No gait instability. Skin:  Skin is warm, dry and intact. No rash noted. Except:  Right lower leg two V shaped healing lacerations present with x 3 sutures and x 15 staples to upper laceration, and x3 sutures and x 10 staples present to lower laceration. Well approximated. Minimal erythema and swelling along wound edges, mild healing ecchymosis at laceration sites, no exudate, no drainage, no purulence, no fluctuance, no signs of infection. Right lower leg nontender. No calf tenderness bilaterally. Negative Homan's sign. Steady gait. No distal leg swelling bilaterally.  Psychiatric: Mood and affect are normal. Speech and behavior are normal.  ____________________________________________   LABS (all labs ordered are listed, but only abnormal results are displayed)  Labs Reviewed - No data to display   PROCEDURES  Procedure(s) performed:  Right anterior lower leg wound cleaned with betadine and saline by RN and myself. Irrigated with saline.   INITIAL IMPRESSION / ASSESSMENT AND PLAN / ED COURSE  Pertinent labs & imaging results that were available during my care of the patient were reviewed by me and considered in my medical decision making (see chart for details).  Very well-appearing patient. No acute distress. Right lower leg with healing lacerations. Two V shaped lacerations to right anterior lower leg present. Staples and sutures intact. Patient had multiple areas of dried crusted blood around the wound edges. Wound soaked and irrigated and saline and Betadine. Wound reexamined. Staples and sutures are not yet ready to be removed. Concern is for removal at this time wound will  dehisce and open. No signs of infection. Patient to complete oral antibiotics (states has one or two days left. Counseled to clean daily and apply topical antibiotics. Elevate. Return in 5 days for wound check and discussed return sooner for redness, pain, fever, swelling, worsening concerns.   Work note given for patient including restrictions that right leg wound must maintain clean and dry, no strenuous activity or walking and avoid impact to right leg. Discussed follow up with Primary care physician this week as needed. Discussed follow up and return parameters including no resolution or any worsening concerns. Patient verbalized understanding and agreed to plan.   ____________________________________________   FINAL CLINICAL IMPRESSION(S) / ED DIAGNOSES  Final diagnoses:  Laceration re-check  Visit for wound check       Marylene Land, NP 03/04/15 [place], MD 03/04/15 [phone]

## 2015-03-04 NOTE — ED Notes (Signed)
Pt is here for suture/wound check of right lower leg

## 2015-03-09 ENCOUNTER — Emergency Department
Admission: EM | Admit: 2015-03-09 | Discharge: 2015-03-09 | Disposition: A | Discharge status: Home / Self Care | Payer: Worker's Compensation | Attending: Emergency Medicine | Admitting: Emergency Medicine

## 2015-03-09 ENCOUNTER — Encounter: Payer: Self-pay | Admitting: Emergency Medicine

## 2015-03-09 DIAGNOSIS — Z7982 Long term (current) use of aspirin: Secondary | ICD-10-CM | POA: Insufficient documentation

## 2015-03-09 DIAGNOSIS — Z4801 Encounter for change or removal of surgical wound dressing: Secondary | ICD-10-CM | POA: Diagnosis not present

## 2015-03-09 DIAGNOSIS — Z720 Tobacco use: Secondary | ICD-10-CM | POA: Insufficient documentation

## 2015-03-09 DIAGNOSIS — Z4802 Encounter for removal of sutures: Secondary | ICD-10-CM

## 2015-03-09 NOTE — ED Provider Notes (Signed)
[facility] Emergency Department Provider Note  ____________________________________________  Time seen: Approximately 8:56 AM  I have reviewed the triage vital signs and the nursing notes.   HISTORY  Chief Complaint Suture / Staple Removal    HPI Howard Stafford is a 69 y.o. male patient had a day for removal of staples and sutures from the right lower leg. Patient had this large laceration sutured 2 weeks ago. Patient has taken antibiotics as directed. Patient stated there has been decreased need to take pain medication. Denies any signs or symptoms secondary infection. Patient rates his pain as a 0.  Past Medical History  Diagnosis Date  . Skin cancer face    There are no active problems to display for this patient.   Past Surgical History  Procedure Laterality Date  . Back surgery      Current Outpatient Rx  Name  Route  Sig  Dispense  Refill  . aspirin 325 MG EC tablet   Oral   Take 325 mg by mouth daily.         Marland Kitchen oxyCODONE-acetaminophen (PERCOCET) 5-325 MG tablet   Oral   Take 2 tablets by mouth every 6 (six) hours as needed for moderate pain or severe pain.   30 tablet   0     Allergies Review of patient's allergies indicates no known allergies.  History reviewed. No pertinent family history.  Social History Social History  Substance Use Topics  . Smoking status: Current Every Day Smoker -- 1.00 packs/day    Types: Cigarettes  . Smokeless tobacco: Never Used  . Alcohol Use: Yes     Comment: Everynight [name]    Review of Systems Constitutional: No fever/chills Eyes: No visual changes. ENT: No sore throat. Cardiovascular: Denies chest pain. Respiratory: Denies shortness of breath. Gastrointestinal: No abdominal pain.  No nausea, no vomiting.  No diarrhea.  No constipation. Genitourinary: Negative for dysuria. Musculoskeletal: Negative for back pain. Skin: Negative for rash. Neurological: Negative for  headaches, focal weakness or numbness. 10-point ROS otherwise negative.  ____________________________________________   PHYSICAL EXAM:  VITAL SIGNS: ED Triage Vitals  Enc Vitals Group     BP 03/09/15 0845 179/82 mmHg     Pulse Rate 03/09/15 0845 79     Resp 03/09/15 0845 18     Temp 03/09/15 0845 98 F (36.7 C)     Temp Source 03/09/15 0845 Oral     SpO2 03/09/15 0845 100 %     Weight 03/09/15 0845 195 lb (88.451 kg)     Height 03/09/15 0845 5\' 11"  (1.803 m)     Head Cir --      Peak Flow --      Pain Score 03/09/15 0844 0     Pain Loc --      Pain Edu? --      Excl. in [place]? --     Constitutional: Alert and oriented. Well appearing and in no acute distress. Eyes: Conjunctivae are normal. PERRL. EOMI. Head: Atraumatic. Nose: No congestion/rhinnorhea. Mouth/Throat: Mucous membranes are moist.  Oropharynx non-erythematous. Neck: No stridor. No cervical spine tenderness to palpation. Hematological/Lymphatic/Immunilogical: No cervical lymphadenopathy. Cardiovascular: Normal rate, regular rhythm. Grossly normal heart sounds.  Good peripheral circulation. Elevated blood pressure Respiratory: Normal respiratory effort.  No retractions. Lungs CTAB. Gastrointestinal: Soft and nontender. No distention. No abdominal bruits. No CVA tenderness. Musculoskeletal: No lower extremity tenderness nor edema.  No joint effusions. Neurologic:  Normal speech and language. No gross focal neurologic  deficits are appreciated. No gait instability. Skin:  Skin is warm, dry and intact. No rash noted. Sutured and stapled laceration right lower leg without signs symptoms secondary infection. Extremities neurovascularly intact free nuchal range of motion. Psychiatric: Mood and affect are normal. Speech and behavior are normal.  ____________________________________________   LABS (all labs ordered are listed, but only abnormal results are displayed)  Labs Reviewed - No data to  display ____________________________________________  EKG   ____________________________________________  RADIOLOGY   ____________________________________________   PROCEDURES  Procedure(s) performed: None  Critical Care performed: No  ____________________________________________   INITIAL IMPRESSION / ASSESSMENT AND PLAN / ED COURSE  Pertinent labs & imaging results that were available during my care of the patient were reviewed by me and considered in my medical decision making (see chart for details).  Encounter for suture removal. Patient has a total dose 25 staples removed and for 9 sutures removal. There is a reclean and bandage patient were advised on home care. Advised return by ER for condition worsens. ____________________________________________   FINAL CLINICAL IMPRESSION(S) / ED DIAGNOSES  Final diagnoses:  Encounter for removal of staples  Encounter for removal of sutures       Sable Feil, PA-C 03/09/15 [place], PA-C 03/09/15 [id number]  Daymon Larsen, MD 03/09/15 1035

## 2015-03-09 NOTE — ED Notes (Signed)
Pt to ed with c/o suture removal of right leg sutures.  Pt states they have been in for 2 weeks.

## 2015-03-09 NOTE — Discharge Instructions (Signed)

## 2015-06-28 DIAGNOSIS — L570 Actinic keratosis: Secondary | ICD-10-CM | POA: Diagnosis not present

## 2015-06-28 DIAGNOSIS — Z08 Encounter for follow-up examination after completed treatment for malignant neoplasm: Secondary | ICD-10-CM | POA: Diagnosis not present

## 2015-06-28 DIAGNOSIS — L821 Other seborrheic keratosis: Secondary | ICD-10-CM | POA: Diagnosis not present

## 2015-06-28 DIAGNOSIS — Z1283 Encounter for screening for malignant neoplasm of skin: Secondary | ICD-10-CM | POA: Diagnosis not present

## 2015-06-28 DIAGNOSIS — Z85828 Personal history of other malignant neoplasm of skin: Secondary | ICD-10-CM | POA: Diagnosis not present

## 2015-06-28 DIAGNOSIS — Z85820 Personal history of malignant melanoma of skin: Secondary | ICD-10-CM | POA: Diagnosis not present

## 2015-12-21 DIAGNOSIS — Z85820 Personal history of malignant melanoma of skin: Secondary | ICD-10-CM | POA: Diagnosis not present

## 2015-12-21 DIAGNOSIS — L570 Actinic keratosis: Secondary | ICD-10-CM | POA: Diagnosis not present

## 2015-12-21 DIAGNOSIS — Z08 Encounter for follow-up examination after completed treatment for malignant neoplasm: Secondary | ICD-10-CM | POA: Diagnosis not present

## 2015-12-21 DIAGNOSIS — L821 Other seborrheic keratosis: Secondary | ICD-10-CM | POA: Diagnosis not present

## 2015-12-21 DIAGNOSIS — Z1283 Encounter for screening for malignant neoplasm of skin: Secondary | ICD-10-CM | POA: Diagnosis not present

## 2015-12-21 DIAGNOSIS — Z85828 Personal history of other malignant neoplasm of skin: Secondary | ICD-10-CM | POA: Diagnosis not present

## 2015-12-27 ENCOUNTER — Other Ambulatory Visit: Payer: Self-pay

## 2016-06-19 DIAGNOSIS — Z872 Personal history of diseases of the skin and subcutaneous tissue: Secondary | ICD-10-CM | POA: Diagnosis not present

## 2016-06-19 DIAGNOSIS — L570 Actinic keratosis: Secondary | ICD-10-CM | POA: Diagnosis not present

## 2016-06-19 DIAGNOSIS — Z85820 Personal history of malignant melanoma of skin: Secondary | ICD-10-CM | POA: Diagnosis not present

## 2016-06-19 DIAGNOSIS — Z85828 Personal history of other malignant neoplasm of skin: Secondary | ICD-10-CM | POA: Diagnosis not present

## 2016-12-17 DIAGNOSIS — L570 Actinic keratosis: Secondary | ICD-10-CM | POA: Diagnosis not present

## 2016-12-17 DIAGNOSIS — C44719 Basal cell carcinoma of skin of left lower limb, including hip: Secondary | ICD-10-CM | POA: Diagnosis not present

## 2016-12-17 DIAGNOSIS — Z85828 Personal history of other malignant neoplasm of skin: Secondary | ICD-10-CM | POA: Diagnosis not present

## 2016-12-17 DIAGNOSIS — D485 Neoplasm of uncertain behavior of skin: Secondary | ICD-10-CM | POA: Diagnosis not present

## 2016-12-17 DIAGNOSIS — Z872 Personal history of diseases of the skin and subcutaneous tissue: Secondary | ICD-10-CM | POA: Diagnosis not present

## 2016-12-17 DIAGNOSIS — Z85820 Personal history of malignant melanoma of skin: Secondary | ICD-10-CM | POA: Diagnosis not present

## 2017-01-12 ENCOUNTER — Ambulatory Visit
Admission: EM | Admit: 2017-01-12 | Discharge: 2017-01-12 | Disposition: A | Discharge status: Home / Self Care | Payer: Medicare Other | Attending: Family Medicine | Admitting: Family Medicine

## 2017-01-12 ENCOUNTER — Encounter: Payer: Self-pay | Admitting: Gynecology

## 2017-01-12 DIAGNOSIS — L03113 Cellulitis of right upper limb: Secondary | ICD-10-CM

## 2017-01-12 DIAGNOSIS — M25521 Pain in right elbow: Secondary | ICD-10-CM

## 2017-01-12 MED ORDER — DICLOXACILLIN SODIUM 500 MG PO CAPS: 500.0000 mg | ORAL_CAPSULE | Freq: Four times a day (QID) | ORAL | 0 refills | Status: DC

## 2017-01-12 NOTE — Discharge Instructions (Signed)
Elevate arm; ibuprofen; warm compresses to elbow area

## 2017-01-12 NOTE — ED Provider Notes (Signed)
[facility]    CSN: [id number] Arrival date & time: 01/12/17  0850     History   Chief Complaint Chief Complaint  Patient presents with  . Joint Swelling    HPI Howard Stafford is a 71 y.o. male.   71 yo male with a c/o right elbow slight swelling and mild pain since last night. Denies any trauma, injuries, fevers, chills.    The history is provided by the patient.    Past Medical History:  Diagnosis Date  . Skin cancer face    There are no active problems to display for this patient.   Past Surgical History:  Procedure Laterality Date  . BACK SURGERY         Home Medications    Prior to Admission medications   Medication Sig Start Date End Date Taking? Authorizing Provider  aspirin 325 MG EC tablet Take 325 mg by mouth daily.   Yes [provider]  dicloxacillin (DYNAPEN) 500 MG capsule Take 1 capsule (500 mg total) by mouth 4 (four) times daily. 01/12/17   Norval Gable, MD  oxyCODONE-acetaminophen (PERCOCET) 5-325 MG tablet Take 2 tablets by mouth every 6 (six) hours as needed for moderate pain or severe pain. 02/22/15   Earleen Newport, MD    Family History No family history on file.  Social History Social History  Substance Use Topics  . Smoking status: Current Every Day Smoker    Packs/day: 1.00    Types: Cigarettes  . Smokeless tobacco: Never Used  . Alcohol use Yes     Comment: Everynight [name]     Allergies   Patient has no known allergies.   Review of Systems Review of Systems   Physical Exam Triage Vital Signs ED Triage Vitals  Enc Vitals Group     BP 01/12/17 0906 (!) 169/77     Pulse Rate 01/12/17 0906 67     Resp 01/12/17 0906 16     Temp 01/12/17 0906 98 F (36.7 C)     Temp Source 01/12/17 0906 Oral     SpO2 01/12/17 0906 100 %     Weight 01/12/17 0908 190 lb (86.2 kg)     Height 01/12/17 0908 5\' 10"  (1.778 m)     Head Circumference --      Peak Flow --      Pain Score 01/12/17  0908 5     Pain Loc --      Pain Edu? --      Excl. in [place]? --    No data found.   Updated Vital Signs BP (!) 150/80 (BP Location: Right Arm)   Pulse 67   Temp 98 F (36.7 C) (Oral)   Resp 16   Ht 5\' 10"  (1.778 m)   Wt 190 lb (86.2 kg)   SpO2 100%   BMI 27.26 kg/m   Visual Acuity Right Eye Distance:   Left Eye Distance:   Bilateral Distance:    Right Eye Near:   Left Eye Near:    Bilateral Near:     Physical Exam  Constitutional: He appears well-developed and well-nourished. No distress.  Musculoskeletal:       Right elbow: He exhibits swelling (mild; as well as mild skin blanchable erythema). He exhibits normal range of motion, no effusion, no deformity and no laceration. Tenderness found. Olecranon process (mild) tenderness noted.  Skin: He is not diaphoretic.  Nursing note and vitals reviewed.    UC Treatments /  Results  Labs (all labs ordered are listed, but only abnormal results are displayed) Labs Reviewed - No data to display  EKG  EKG Interpretation None       Radiology No results found.  Procedures Procedures (including critical care time)  Medications Ordered in UC Medications - No data to display   Initial Impression / Assessment and Plan / UC Course  I have reviewed the triage vital signs and the nursing notes.  Pertinent labs & imaging results that were available during my care of the patient were reviewed by me and considered in my medical decision making (see chart for details).      Final Clinical Impressions(s) / UC Diagnoses   Final diagnoses:  Cellulitis of right elbow    New Prescriptions Discharge Medication List as of 01/12/2017  9:34 AM    START taking these medications   Details  dicloxacillin (DYNAPEN) 500 MG capsule Take 1 capsule (500 mg total) by mouth 4 (four) times daily., Starting Sat 01/12/2017, Normal       1. diagnosis reviewed with patient 2. rx as per orders above; reviewed possible side effects,  interactions, risks and benefits  3. Recommend supportive treatment with elevation, warm compresses; otc ibuprofen 600mg  three times daily 4. Follow-up prn if symptoms worsen or don't improve  Controlled Substance Prescriptions [place] Controlled Substance Registry consulted? Not Applicable   Norval Gable, MD 01/12/17 1003

## 2017-01-12 NOTE — ED Triage Notes (Signed)
Er patient right elbow swelling / tender and warm to touch x last pm.

## 2017-01-21 DIAGNOSIS — C44712 Basal cell carcinoma of skin of right lower limb, including hip: Secondary | ICD-10-CM | POA: Diagnosis not present

## 2017-01-21 DIAGNOSIS — C44722 Squamous cell carcinoma of skin of right lower limb, including hip: Secondary | ICD-10-CM | POA: Diagnosis not present

## 2017-04-10 DIAGNOSIS — M76821 Posterior tibial tendinitis, right leg: Secondary | ICD-10-CM | POA: Diagnosis not present

## 2017-06-19 DIAGNOSIS — Z85820 Personal history of malignant melanoma of skin: Secondary | ICD-10-CM | POA: Diagnosis not present

## 2017-06-19 DIAGNOSIS — L578 Other skin changes due to chronic exposure to nonionizing radiation: Secondary | ICD-10-CM | POA: Diagnosis not present

## 2017-06-19 DIAGNOSIS — Z85828 Personal history of other malignant neoplasm of skin: Secondary | ICD-10-CM | POA: Diagnosis not present

## 2017-06-19 DIAGNOSIS — Z859 Personal history of malignant neoplasm, unspecified: Secondary | ICD-10-CM | POA: Diagnosis not present

## 2017-06-19 DIAGNOSIS — L570 Actinic keratosis: Secondary | ICD-10-CM | POA: Diagnosis not present

## 2017-12-17 DIAGNOSIS — L570 Actinic keratosis: Secondary | ICD-10-CM | POA: Diagnosis not present

## 2017-12-17 DIAGNOSIS — Z1283 Encounter for screening for malignant neoplasm of skin: Secondary | ICD-10-CM | POA: Diagnosis not present

## 2017-12-17 DIAGNOSIS — Z859 Personal history of malignant neoplasm, unspecified: Secondary | ICD-10-CM | POA: Diagnosis not present

## 2017-12-17 DIAGNOSIS — Z85820 Personal history of malignant melanoma of skin: Secondary | ICD-10-CM | POA: Diagnosis not present

## 2017-12-17 DIAGNOSIS — L578 Other skin changes due to chronic exposure to nonionizing radiation: Secondary | ICD-10-CM | POA: Diagnosis not present

## 2017-12-17 DIAGNOSIS — Z85828 Personal history of other malignant neoplasm of skin: Secondary | ICD-10-CM | POA: Diagnosis not present

## 2018-06-18 DIAGNOSIS — Z859 Personal history of malignant neoplasm, unspecified: Secondary | ICD-10-CM | POA: Diagnosis not present

## 2018-06-18 DIAGNOSIS — Z85820 Personal history of malignant melanoma of skin: Secondary | ICD-10-CM | POA: Diagnosis not present

## 2018-06-18 DIAGNOSIS — Z1283 Encounter for screening for malignant neoplasm of skin: Secondary | ICD-10-CM | POA: Diagnosis not present

## 2018-06-18 DIAGNOSIS — Z872 Personal history of diseases of the skin and subcutaneous tissue: Secondary | ICD-10-CM | POA: Diagnosis not present

## 2018-06-18 DIAGNOSIS — L570 Actinic keratosis: Secondary | ICD-10-CM | POA: Diagnosis not present

## 2018-06-18 DIAGNOSIS — L578 Other skin changes due to chronic exposure to nonionizing radiation: Secondary | ICD-10-CM | POA: Diagnosis not present

## 2018-06-18 DIAGNOSIS — Z85828 Personal history of other malignant neoplasm of skin: Secondary | ICD-10-CM | POA: Diagnosis not present

## 2018-12-24 DIAGNOSIS — Z85820 Personal history of malignant melanoma of skin: Secondary | ICD-10-CM | POA: Diagnosis not present

## 2018-12-24 DIAGNOSIS — C4441 Basal cell carcinoma of skin of scalp and neck: Secondary | ICD-10-CM | POA: Diagnosis not present

## 2018-12-24 DIAGNOSIS — Z859 Personal history of malignant neoplasm, unspecified: Secondary | ICD-10-CM | POA: Diagnosis not present

## 2018-12-24 DIAGNOSIS — Z85828 Personal history of other malignant neoplasm of skin: Secondary | ICD-10-CM | POA: Diagnosis not present

## 2018-12-24 DIAGNOSIS — L578 Other skin changes due to chronic exposure to nonionizing radiation: Secondary | ICD-10-CM | POA: Diagnosis not present

## 2018-12-24 DIAGNOSIS — L570 Actinic keratosis: Secondary | ICD-10-CM | POA: Diagnosis not present

## 2018-12-24 DIAGNOSIS — Z872 Personal history of diseases of the skin and subcutaneous tissue: Secondary | ICD-10-CM | POA: Diagnosis not present

## 2018-12-24 DIAGNOSIS — D485 Neoplasm of uncertain behavior of skin: Secondary | ICD-10-CM | POA: Diagnosis not present

## 2019-01-21 DIAGNOSIS — C4491 Basal cell carcinoma of skin, unspecified: Secondary | ICD-10-CM | POA: Diagnosis not present

## 2019-01-21 DIAGNOSIS — C4441 Basal cell carcinoma of skin of scalp and neck: Secondary | ICD-10-CM | POA: Diagnosis not present

## 2019-06-03 DIAGNOSIS — Z23 Encounter for immunization: Secondary | ICD-10-CM | POA: Diagnosis not present

## 2019-06-25 DIAGNOSIS — L570 Actinic keratosis: Secondary | ICD-10-CM | POA: Diagnosis not present

## 2019-06-25 DIAGNOSIS — Z85828 Personal history of other malignant neoplasm of skin: Secondary | ICD-10-CM | POA: Diagnosis not present

## 2019-06-25 DIAGNOSIS — Z85820 Personal history of malignant melanoma of skin: Secondary | ICD-10-CM | POA: Diagnosis not present

## 2019-06-25 DIAGNOSIS — L578 Other skin changes due to chronic exposure to nonionizing radiation: Secondary | ICD-10-CM | POA: Diagnosis not present

## 2019-07-01 DIAGNOSIS — Z23 Encounter for immunization: Secondary | ICD-10-CM | POA: Diagnosis not present

## 2019-12-28 DIAGNOSIS — D485 Neoplasm of uncertain behavior of skin: Secondary | ICD-10-CM | POA: Diagnosis not present

## 2019-12-28 DIAGNOSIS — C441221 Squamous cell carcinoma of skin of right upper eyelid, including canthus: Secondary | ICD-10-CM | POA: Diagnosis not present

## 2019-12-28 DIAGNOSIS — Z872 Personal history of diseases of the skin and subcutaneous tissue: Secondary | ICD-10-CM | POA: Diagnosis not present

## 2019-12-28 DIAGNOSIS — L578 Other skin changes due to chronic exposure to nonionizing radiation: Secondary | ICD-10-CM | POA: Diagnosis not present

## 2019-12-28 DIAGNOSIS — Z85820 Personal history of malignant melanoma of skin: Secondary | ICD-10-CM | POA: Diagnosis not present

## 2019-12-28 DIAGNOSIS — L570 Actinic keratosis: Secondary | ICD-10-CM | POA: Diagnosis not present

## 2019-12-28 DIAGNOSIS — Z85828 Personal history of other malignant neoplasm of skin: Secondary | ICD-10-CM | POA: Diagnosis not present

## 2019-12-28 DIAGNOSIS — Z859 Personal history of malignant neoplasm, unspecified: Secondary | ICD-10-CM | POA: Diagnosis not present

## 2020-03-09 DIAGNOSIS — C441291 Squamous cell carcinoma of skin of left upper eyelid, including canthus: Secondary | ICD-10-CM | POA: Diagnosis not present

## 2020-04-07 DIAGNOSIS — C441292 Squamous cell carcinoma of skin of left lower eyelid, including canthus: Secondary | ICD-10-CM | POA: Diagnosis not present

## 2020-04-08 DIAGNOSIS — H02115 Cicatricial ectropion of left lower eyelid: Secondary | ICD-10-CM | POA: Diagnosis not present

## 2020-04-08 DIAGNOSIS — C441291 Squamous cell carcinoma of skin of left upper eyelid, including canthus: Secondary | ICD-10-CM | POA: Diagnosis not present

## 2020-04-08 DIAGNOSIS — F17210 Nicotine dependence, cigarettes, uncomplicated: Secondary | ICD-10-CM | POA: Diagnosis not present

## 2020-04-08 DIAGNOSIS — Z7982 Long term (current) use of aspirin: Secondary | ICD-10-CM | POA: Diagnosis not present

## 2020-04-08 HISTORY — PX: EYE SURGERY: SHX253

## 2020-04-20 DIAGNOSIS — C441291 Squamous cell carcinoma of skin of left upper eyelid, including canthus: Secondary | ICD-10-CM | POA: Diagnosis not present

## 2020-06-29 DIAGNOSIS — Z85820 Personal history of malignant melanoma of skin: Secondary | ICD-10-CM | POA: Diagnosis not present

## 2020-06-29 DIAGNOSIS — L578 Other skin changes due to chronic exposure to nonionizing radiation: Secondary | ICD-10-CM | POA: Diagnosis not present

## 2020-06-29 DIAGNOSIS — Z859 Personal history of malignant neoplasm, unspecified: Secondary | ICD-10-CM | POA: Diagnosis not present

## 2020-06-29 DIAGNOSIS — L570 Actinic keratosis: Secondary | ICD-10-CM | POA: Diagnosis not present

## 2020-06-29 DIAGNOSIS — Z872 Personal history of diseases of the skin and subcutaneous tissue: Secondary | ICD-10-CM | POA: Diagnosis not present

## 2020-06-29 DIAGNOSIS — Z85828 Personal history of other malignant neoplasm of skin: Secondary | ICD-10-CM | POA: Diagnosis not present

## 2020-10-14 ENCOUNTER — Ambulatory Visit
Admission: EM | Admit: 2020-10-14 | Discharge: 2020-10-14 | Disposition: A | Discharge status: Home / Self Care | Payer: Medicare Other | Attending: Family Medicine | Admitting: Family Medicine

## 2020-10-14 ENCOUNTER — Encounter: Payer: Self-pay | Admitting: Emergency Medicine

## 2020-10-14 ENCOUNTER — Other Ambulatory Visit: Payer: Self-pay

## 2020-10-14 DIAGNOSIS — J019 Acute sinusitis, unspecified: Secondary | ICD-10-CM | POA: Diagnosis not present

## 2020-10-14 MED ORDER — AZITHROMYCIN 250 MG PO TABS: ORAL_TABLET | ORAL | 0 refills | Status: DC

## 2020-10-14 NOTE — ED Provider Notes (Signed)
[facility]    CSN: [id number] Arrival date & time: 10/14/20  1257      History   Chief Complaint Chief Complaint  Patient presents with   Sinus Problem   HPI  75 year old male presents with the above complaint.  Symptoms for 1.5 weeks.  Sinus congestion, discolored nose nasal discharge.  Seems to be worsening.  Minimal pain at this time.  No relieving factors.  No documented fever.  No other complaints or concerns at this time.   Past Medical History:  Diagnosis Date   Skin cancer face   Past Surgical History:  Procedure Laterality Date   BACK SURGERY     EYE SURGERY     Home Medications    Prior to Admission medications   Medication Sig Start Date End Date Taking? Authorizing Provider  azithromycin (ZITHROMAX) 250 MG tablet 2 tablets on day 1, then 1 tablet daily on days 2-5. 10/14/20  Yes Thersa Salt G, DO  aspirin 325 MG EC tablet Take 325 mg by mouth daily.    [provider]   Social History Social History   Tobacco Use   Smoking status: Every Day    Packs/day: 1.00    Pack years: 0.00    Types: Cigarettes   Smokeless tobacco: Never  Substance Use Topics   Alcohol use: Yes    Comment: Everynight [name]   Drug use: No     Allergies   Patient has no known allergies.   Review of Systems Review of Systems Per HPI  Physical Exam Triage Vital Signs ED Triage Vitals  Enc Vitals Group     BP 10/14/20 1344 (!) 155/76     Pulse Rate 10/14/20 1344 78     Resp 10/14/20 1344 16     Temp 10/14/20 1344 98.3 F (36.8 C)     Temp Source 10/14/20 1344 Oral     SpO2 10/14/20 1344 95 %     Weight 10/14/20 1341 195 lb (88.5 kg)     Height 10/14/20 1341 5\' 10"  (1.778 m)     Head Circumference --      Peak Flow --      Pain Score 10/14/20 1341 0     Pain Loc --      Pain Edu? --      Excl. in [place]? --    Updated Vital Signs BP (!) 155/76 (BP Location: Left Arm)   Pulse 78   Temp 98.3 F (36.8 C) (Oral)   Resp 16   Ht 5'  10" (1.778 m)   Wt 88.5 kg   SpO2 95%   BMI 27.98 kg/m   Visual Acuity Right Eye Distance:   Left Eye Distance:   Bilateral Distance:    Right Eye Near:   Left Eye Near:    Bilateral Near:     Physical Exam Vitals and nursing note reviewed.  Constitutional:      General: He is not in acute distress.    Appearance: Normal appearance. He is not ill-appearing.  HENT:     Head: Normocephalic and atraumatic.     Nose: Congestion present.  Eyes:     General:        Right eye: No discharge.        Left eye: No discharge.     Conjunctiva/sclera: Conjunctivae normal.  Cardiovascular:     Rate and Rhythm: Normal rate and regular rhythm.  Pulmonary:     Effort: Pulmonary effort is normal.  Breath sounds: Normal breath sounds. No wheezing, rhonchi or rales.  Neurological:     Mental Status: He is alert.  Psychiatric:        Mood and Affect: Mood normal.        Behavior: Behavior normal.    UC Treatments / Results  Labs (all labs ordered are listed, but only abnormal results are displayed) Labs Reviewed - No data to display  EKG   Radiology No results found.  Procedures Procedures (including critical care time)  Medications Ordered in UC Medications - No data to display  Initial Impression / Assessment and Plan / UC Course  I have reviewed the triage vital signs and the nursing notes.  Pertinent labs & imaging results that were available during my care of the patient were reviewed by me and considered in my medical decision making (see chart for details).    75 year old male presents with sinusitis. Treating with Azithromycin (patient preference).   Final Clinical Impressions(s) / UC Diagnoses   Final diagnoses:  Acute sinusitis, recurrence not specified, unspecified location   Discharge Instructions   None    ED Prescriptions     Medication Sig Dispense Auth. Provider   azithromycin (ZITHROMAX) 250 MG tablet 2 tablets on day 1, then 1 tablet daily  on days 2-5. 6 tablet Coral Spikes, DO      PDMP not reviewed this encounter.   Coral Spikes, [place] 10/14/20 1452

## 2020-10-14 NOTE — ED Triage Notes (Signed)
Patient c/o sinus congestion and pressure that started over a week ago.  Patient denies fevers.

## 2020-12-20 DIAGNOSIS — L578 Other skin changes due to chronic exposure to nonionizing radiation: Secondary | ICD-10-CM | POA: Diagnosis not present

## 2020-12-20 DIAGNOSIS — Z859 Personal history of malignant neoplasm, unspecified: Secondary | ICD-10-CM | POA: Diagnosis not present

## 2020-12-20 DIAGNOSIS — Z85828 Personal history of other malignant neoplasm of skin: Secondary | ICD-10-CM | POA: Diagnosis not present

## 2020-12-20 DIAGNOSIS — Z872 Personal history of diseases of the skin and subcutaneous tissue: Secondary | ICD-10-CM | POA: Diagnosis not present

## 2020-12-20 DIAGNOSIS — L570 Actinic keratosis: Secondary | ICD-10-CM | POA: Diagnosis not present

## 2020-12-20 DIAGNOSIS — D485 Neoplasm of uncertain behavior of skin: Secondary | ICD-10-CM | POA: Diagnosis not present

## 2021-02-20 DIAGNOSIS — C44311 Basal cell carcinoma of skin of nose: Secondary | ICD-10-CM | POA: Diagnosis not present

## 2021-02-20 DIAGNOSIS — D485 Neoplasm of uncertain behavior of skin: Secondary | ICD-10-CM | POA: Diagnosis not present

## 2021-04-26 DIAGNOSIS — C44311 Basal cell carcinoma of skin of nose: Secondary | ICD-10-CM | POA: Diagnosis not present

## 2021-06-01 DIAGNOSIS — H34812 Central retinal vein occlusion, left eye, with macular edema: Secondary | ICD-10-CM | POA: Diagnosis not present

## 2021-06-05 DIAGNOSIS — I10 Essential (primary) hypertension: Secondary | ICD-10-CM | POA: Diagnosis not present

## 2021-06-05 DIAGNOSIS — Z125 Encounter for screening for malignant neoplasm of prostate: Secondary | ICD-10-CM | POA: Diagnosis not present

## 2021-06-05 DIAGNOSIS — R030 Elevated blood-pressure reading, without diagnosis of hypertension: Secondary | ICD-10-CM | POA: Diagnosis not present

## 2021-06-05 DIAGNOSIS — H34813 Central retinal vein occlusion, bilateral, with macular edema: Secondary | ICD-10-CM | POA: Diagnosis not present

## 2021-06-05 DIAGNOSIS — Z131 Encounter for screening for diabetes mellitus: Secondary | ICD-10-CM | POA: Diagnosis not present

## 2021-06-05 DIAGNOSIS — H34812 Central retinal vein occlusion, left eye, with macular edema: Secondary | ICD-10-CM | POA: Diagnosis not present

## 2021-06-06 DIAGNOSIS — H2513 Age-related nuclear cataract, bilateral: Secondary | ICD-10-CM | POA: Diagnosis not present

## 2021-06-06 DIAGNOSIS — H34812 Central retinal vein occlusion, left eye, with macular edema: Secondary | ICD-10-CM | POA: Diagnosis not present

## 2021-06-08 NOTE — Progress Notes (Addendum)
06/09/21 9:33 AM   Howard Stafford February 02, 1946 [id number]  Referring provider:  Albina Billet, MD [address]   [place],  [place] [address] Chief Complaint  Patient presents with   New Patient (Initial Visit)   Elevated PSA     HPI: Howard Stafford is a 76 y.o.male who presents today for further evaluation of elevated PSA.   His most recent PSA on 06/02/2021 was 92.1.   He reports that prior to this, he was not going to the doctor on a regular basis.  This is his first PSA screen.  He denies a personal or family history of prostate cancer or other malignancies.  325 mg of aspirin is on his list but he declines taking this medication.  Denies any baseline urinary symptoms.  He is a smoker.  He does note that about 15 years ago, he was seen and evaluated for a right-sided hydrocele.  It is remained stable.  He is minimally symptomatic for this yet.  He was offered hydrocele or aspiration previously.   PMH: Past Medical History:  Diagnosis Date   Skin cancer face    Surgical History: Past Surgical History:  Procedure Laterality Date   BACK SURGERY     EYE SURGERY      Home Medications:  Allergies as of 06/09/2021   No Known Allergies      Medication List        Accurate as of June 09, 2021  9:33 AM. If you have any questions, ask your nurse or doctor.          STOP taking these medications    azithromycin 250 MG tablet Commonly known as: ZITHROMAX Stopped by: Hollice Espy, MD       TAKE these medications    aspirin 325 MG EC tablet Take 325 mg by mouth daily.   lisinopril 10 MG tablet Commonly known as: ZESTRIL Take 10 mg by mouth daily.        Allergies: No Known Allergies  Family History: No family history on file.  Social History:  reports that he has been smoking cigarettes. He has been smoking an average of 1 pack per day. He has been exposed to tobacco smoke. He has never used smokeless tobacco. He reports  current alcohol use. He reports that he does not use drugs.   Physical Exam: BP (!) 186/81    Pulse 81    Ht 5\' 10"  (1.778 m)    Wt 197 lb (89.4 kg)    BMI 28.27 kg/m   Constitutional:  Alert and oriented, No acute distress. HEENT: [place] AT, moist mucus membranes.  Trachea midline, no masses. Cardiovascular: No clubbing, cyanosis, or edema. Respiratory: Normal respiratory effort, no increased work of breathing. Rectal: External hemorrhoid appreciated.  Normal sphincter tone.  Grossly abnormal prostate, firm induration in the right mid gland as well as questionable nodules on the left.  Right softball size hydrocele appreciated. Skin: No rashes, bruises or suspicious lesions. Neurologic: Grossly intact, no focal deficits, moving all 4 extremities. Psychiatric: Normal mood and affect.  Laboratory Data: Scanned labs reviewed.  Urinalysis Pending    Assessment & Plan:   Elevated PSA  - We reviewed the implications of an elevated PSA and the uncertainty surrounding it. In general, a man's PSA increases with age and is produced by both normal and cancerous prostate tissue. Differential for elevated PSA is BPH, prostate cancer, infection, recent intercourse/ejaculation, prostate infarction, recent urethroscopic manipulation (foley placement/cystoscopy) and  prostatitis. Management of an elevated PSA can include observation or prostate biopsy and wediscussed this in detail. We discussed that indications for prostate biopsy are defined by age and race specific PSA cutoffs as well as a PSA velocity of 0.75/year. -Prostate exam is abnormal, highly suspicious for prostate cancer.  Recommend proceeding with biopsy as soon as possible. -We discussed prostate biopsy in detail including the procedure itself, the risks of blood in the urine, stool, and ejaculate, serious infection, and discomfort. He is willing to proceed with this as discussed. -UA to rule out infection is contributing factor although not  suspected   2.  Right hydrocele -Asymptomatic, declined intervention    [name] as a scribe for Hollice Espy, MD.,have documented all relevant documentation on the behalf of Hollice Espy, MD,as directed by  Hollice Espy, MD while in the presence of Hollice Espy, MD.  I have reviewed the above documentation for accuracy and completeness, and I agree with the above.   Hollice Espy, MD  [facility] [address], [place], [place] [address] [phone]

## 2021-06-09 ENCOUNTER — Other Ambulatory Visit: Payer: Self-pay

## 2021-06-09 ENCOUNTER — Other Ambulatory Visit
Admission: RE | Admit: 2021-06-09 | Discharge: 2021-06-09 | Disposition: A | Discharge status: Home / Self Care | Payer: Medicare Other | Attending: Urology | Admitting: Urology

## 2021-06-09 ENCOUNTER — Ambulatory Visit (INDEPENDENT_AMBULATORY_CARE_PROVIDER_SITE_OTHER): Payer: Medicare Other | Admitting: Urology

## 2021-06-09 ENCOUNTER — Encounter: Payer: Self-pay | Admitting: Urology

## 2021-06-09 VITALS — BP 186/81 | HR 81 | Ht 70.0 in | Wt 197.0 lb

## 2021-06-09 DIAGNOSIS — R972 Elevated prostate specific antigen [PSA]: Secondary | ICD-10-CM | POA: Diagnosis not present

## 2021-06-09 LAB — URINALYSIS, COMPLETE (UACMP) WITH MICROSCOPIC
Bilirubin Urine: NEGATIVE
Glucose, UA: NEGATIVE mg/dL
Ketones, ur: NEGATIVE mg/dL
Leukocytes,Ua: NEGATIVE
Nitrite: NEGATIVE
Protein, ur: NEGATIVE mg/dL
Specific Gravity, Urine: 1.01 (ref 1.005–1.030)
pH: 5.5 (ref 5.0–8.0)

## 2021-06-09 NOTE — Patient Instructions (Signed)

## 2021-06-13 NOTE — Progress Notes (Signed)
° °  06/14/21  CC:  Chief Complaint  Patient presents with   Prostate Biopsy    HPI: Howard Stafford is a 76 y.o. male with a personal history of elevated PSA who presents today for prostate biopsy.   His most recent PSA on 06/02/2021 was 92.1.   He is a current smoker   Rectal exam on 06/09/2021 showed grossly abnormal prostate, firm induration in the right mid gland as well as questionable nodules on the left. Right softball size hydrocele appreciated.   Vitals:   06/14/21 1343  BP: (!) 183/72  Pulse: 81  NED. A&Ox3.   No respiratory distress   Abd soft, NT, ND Normal external genitalia with patent urethral meatus  Prostate Biopsy Procedure   Informed consent was obtained after discussing risks/benefits of the procedure.  A time out was performed to ensure correct patient identity.  Pre-Procedure: - Last PSA Level: 92.1 - Gentamicin given prophylactically - Levaquin 500 mg administered PO -Transrectal Ultrasound performed revealing a 54.75 gm prostate -No significant hypoechoic or median lobe noted Procedure: - Prostate block performed using 10 cc 1% lidocaine and biopsies taken from sextant areas, a total of 12 under ultrasound guidance.   Post-Procedure: - He had a vasovagal/syncopal episode urinary incontinence and diaphoresis that lasted about 2 minute, incontinent of urine during this time but ultimately became alert, attentive.  He had good peripheral pulses throughout the event and was respiring at a normal rate.   - He was counseled to seek immediate medical attention if experiences any severe pain, significant bleeding, or fevers - Return in one week to discuss biopsy results  I,Kailey Littlejohn,acting as a scribe for Hollice Espy, MD.,have documented all relevant documentation on the behalf of Hollice Espy, MD,as directed by  Hollice Espy, MD while in the presence of Hollice Espy, MD.  I have reviewed the above documentation for accuracy and  completeness, and I agree with the above.   Hollice Espy, MD   Addendum: At the patient was being transported to his car in a wheelchair, he had another second episode of diaphoresis, lightheadedness, nausea with vomiting and fecal incontinence.  He became cold and clammy.  He was brought back into our office at which time he was satting well, 96% on room air, pulse in the 70s however his blood pressure was still low at 80/66 and as such, given the second presyncopal episode, he was taken to the emergency room for further evaluation for.  Both he and his wife are more comfortable with this.  He was taken by our staff via wheelchair.

## 2021-06-14 ENCOUNTER — Emergency Department: Payer: Medicare Other

## 2021-06-14 ENCOUNTER — Other Ambulatory Visit: Payer: Self-pay

## 2021-06-14 ENCOUNTER — Encounter: Payer: Self-pay | Admitting: Urology

## 2021-06-14 ENCOUNTER — Observation Stay
Admission: EM | Admit: 2021-06-14 | Discharge: 2021-06-15 | Disposition: A | Discharge status: Home / Self Care | Payer: Medicare Other | Attending: Internal Medicine | Admitting: Internal Medicine

## 2021-06-14 ENCOUNTER — Ambulatory Visit (INDEPENDENT_AMBULATORY_CARE_PROVIDER_SITE_OTHER): Payer: Medicare Other | Admitting: Urology

## 2021-06-14 VITALS — BP 183/72 | HR 81

## 2021-06-14 DIAGNOSIS — Z20822 Contact with and (suspected) exposure to covid-19: Secondary | ICD-10-CM | POA: Diagnosis not present

## 2021-06-14 DIAGNOSIS — Z8546 Personal history of malignant neoplasm of prostate: Secondary | ICD-10-CM | POA: Diagnosis not present

## 2021-06-14 DIAGNOSIS — R972 Elevated prostate specific antigen [PSA]: Secondary | ICD-10-CM

## 2021-06-14 DIAGNOSIS — R569 Unspecified convulsions: Secondary | ICD-10-CM | POA: Diagnosis not present

## 2021-06-14 DIAGNOSIS — I6789 Other cerebrovascular disease: Secondary | ICD-10-CM | POA: Insufficient documentation

## 2021-06-14 DIAGNOSIS — Z9889 Other specified postprocedural states: Secondary | ICD-10-CM | POA: Diagnosis not present

## 2021-06-14 DIAGNOSIS — K625 Hemorrhage of anus and rectum: Secondary | ICD-10-CM | POA: Diagnosis not present

## 2021-06-14 DIAGNOSIS — N433 Hydrocele, unspecified: Secondary | ICD-10-CM | POA: Diagnosis not present

## 2021-06-14 DIAGNOSIS — R6510 Systemic inflammatory response syndrome (SIRS) of non-infectious origin without acute organ dysfunction: Secondary | ICD-10-CM | POA: Insufficient documentation

## 2021-06-14 DIAGNOSIS — J3489 Other specified disorders of nose and nasal sinuses: Secondary | ICD-10-CM | POA: Diagnosis not present

## 2021-06-14 DIAGNOSIS — C61 Malignant neoplasm of prostate: Secondary | ICD-10-CM

## 2021-06-14 DIAGNOSIS — M79604 Pain in right leg: Secondary | ICD-10-CM

## 2021-06-14 DIAGNOSIS — M21371 Foot drop, right foot: Secondary | ICD-10-CM

## 2021-06-14 DIAGNOSIS — M25551 Pain in right hip: Secondary | ICD-10-CM | POA: Insufficient documentation

## 2021-06-14 DIAGNOSIS — Z8673 Personal history of transient ischemic attack (TIA), and cerebral infarction without residual deficits: Secondary | ICD-10-CM | POA: Diagnosis not present

## 2021-06-14 DIAGNOSIS — Z79899 Other long term (current) drug therapy: Secondary | ICD-10-CM | POA: Diagnosis not present

## 2021-06-14 DIAGNOSIS — Z85828 Personal history of other malignant neoplasm of skin: Secondary | ICD-10-CM | POA: Diagnosis not present

## 2021-06-14 DIAGNOSIS — R55 Syncope and collapse: Secondary | ICD-10-CM | POA: Diagnosis not present

## 2021-06-14 DIAGNOSIS — R200 Anesthesia of skin: Secondary | ICD-10-CM

## 2021-06-14 DIAGNOSIS — I672 Cerebral atherosclerosis: Secondary | ICD-10-CM | POA: Diagnosis not present

## 2021-06-14 DIAGNOSIS — R29818 Other symptoms and signs involving the nervous system: Secondary | ICD-10-CM | POA: Diagnosis not present

## 2021-06-14 DIAGNOSIS — K6289 Other specified diseases of anus and rectum: Secondary | ICD-10-CM | POA: Diagnosis not present

## 2021-06-14 DIAGNOSIS — A419 Sepsis, unspecified organism: Secondary | ICD-10-CM | POA: Diagnosis not present

## 2021-06-14 DIAGNOSIS — I951 Orthostatic hypotension: Secondary | ICD-10-CM

## 2021-06-14 LAB — COMPREHENSIVE METABOLIC PANEL WITH GFR
ALT: 32 U/L (ref 0–44)
AST: 29 U/L (ref 15–41)
Albumin: 4 g/dL (ref 3.5–5.0)
Alkaline Phosphatase: 86 U/L (ref 38–126)
Anion gap: 9 (ref 5–15)
BUN: 17 mg/dL (ref 8–23)
CO2: 22 mmol/L (ref 22–32)
Calcium: 9.1 mg/dL (ref 8.9–10.3)
Chloride: 104 mmol/L (ref 98–111)
Creatinine, Ser: 1.13 mg/dL (ref 0.61–1.24)
GFR, Estimated: >60 mL/min
Glucose, Bld: 130 mg/dL — ABNORMAL HIGH (ref 70–99)
Potassium: 3.6 mmol/L (ref 3.5–5.1)
Sodium: 135 mmol/L (ref 135–145)
Total Bilirubin: 0.8 mg/dL (ref 0.3–1.2)
Total Protein: 7.2 g/dL (ref 6.5–8.1)

## 2021-06-14 LAB — LIPID PANEL
Cholesterol: 216 mg/dL — ABNORMAL HIGH (ref 0–200)
HDL: 34 mg/dL — ABNORMAL LOW (ref >40)
LDL Cholesterol: 158 mg/dL — ABNORMAL HIGH (ref 0–99)
Total CHOL/HDL Ratio: 6.4 RATIO
Triglycerides: 121 mg/dL (ref <150)
VLDL: 24 mg/dL (ref 0–40)

## 2021-06-14 LAB — DIFFERENTIAL
Abs Immature Granulocytes: 0.07 K/uL (ref 0.00–0.07)
Basophils Absolute: 0.1 K/uL (ref 0.0–0.1)
Basophils Relative: 1 %
Eosinophils Absolute: 0.2 K/uL (ref 0.0–0.5)
Eosinophils Relative: 2 %
Immature Granulocytes: 1 %
Lymphocytes Relative: 37 %
Lymphs Abs: 4.5 K/uL — ABNORMAL HIGH (ref 0.7–4.0)
Monocytes Absolute: 1 K/uL (ref 0.1–1.0)
Monocytes Relative: 8 %
Neutro Abs: 6.4 K/uL (ref 1.7–7.7)
Neutrophils Relative %: 51 %

## 2021-06-14 LAB — LACTIC ACID, PLASMA
Lactic Acid, Venous: 2.3 mmol/L (ref 0.5–1.9)
Lactic Acid, Venous: 1.7 mmol/L (ref 0.5–1.9)

## 2021-06-14 LAB — RESP PANEL BY RT-PCR (FLU A&B, COVID) ARPGX2
Influenza A by PCR: NEGATIVE
Influenza B by PCR: NEGATIVE
SARS Coronavirus 2 by RT PCR: NEGATIVE

## 2021-06-14 LAB — CBC
HCT: 47 % (ref 39.0–52.0)
Hemoglobin: 16.5 g/dL (ref 13.0–17.0)
MCH: 32.7 pg (ref 26.0–34.0)
MCHC: 35.1 g/dL (ref 30.0–36.0)
MCV: 93.3 fL (ref 80.0–100.0)
Platelets: 239 K/uL (ref 150–400)
RBC: 5.04 MIL/uL (ref 4.22–5.81)
RDW: 11.9 % (ref 11.5–15.5)
WBC: 12.3 K/uL — ABNORMAL HIGH (ref 4.0–10.5)
nRBC: 0 % (ref 0.0–0.2)

## 2021-06-14 LAB — HEMOGLOBIN A1C
Hgb A1c MFr Bld: 5.3 % (ref 4.8–5.6)
Mean Plasma Glucose: 105.41 mg/dL

## 2021-06-14 LAB — TSH: TSH: 2.003 u[IU]/mL (ref 0.350–4.500)

## 2021-06-14 LAB — CBG MONITORING, ED: Glucose-Capillary: 134 mg/dL — ABNORMAL HIGH (ref 70–99)

## 2021-06-14 LAB — PROTIME-INR
INR: 1.1 (ref 0.8–1.2)
Prothrombin Time: 14.2 seconds (ref 11.4–15.2)

## 2021-06-14 LAB — ETHANOL: Alcohol, Ethyl (B): <10 mg/dL

## 2021-06-14 LAB — MAGNESIUM: Magnesium: 2.3 mg/dL (ref 1.7–2.4)

## 2021-06-14 LAB — APTT: aPTT: 32 s (ref 24–36)

## 2021-06-14 LAB — PROCALCITONIN: Procalcitonin: <0.1 ng/mL

## 2021-06-14 LAB — TROPONIN I (HIGH SENSITIVITY): Troponin I (High Sensitivity): 12 ng/L (ref <18)

## 2021-06-14 MED ORDER — GADOBUTROL 1 MMOL/ML IV SOLN
9.0000 mL | Freq: Once | INTRAVENOUS | Status: AC | PRN
  Administered 2021-06-14: 9 mL via INTRAVENOUS

## 2021-06-14 MED ORDER — LEVOFLOXACIN 500 MG PO TABS
500.0000 mg | ORAL_TABLET | Freq: Once | ORAL | Status: DC
  Administered 2021-06-14: 500 mg via ORAL

## 2021-06-14 MED ORDER — IOHEXOL 300 MG/ML  SOLN
100.0000 mL | Freq: Once | INTRAMUSCULAR | Status: AC | PRN
  Administered 2021-06-14: 100 mL via INTRAVENOUS

## 2021-06-14 MED ORDER — FENTANYL CITRATE PF 50 MCG/ML IJ SOSY
50.0000 ug | PREFILLED_SYRINGE | Freq: Once | INTRAMUSCULAR | Status: AC
  Administered 2021-06-14: 50 ug via INTRAVENOUS
  Filled 2021-06-14: qty 1

## 2021-06-14 MED ORDER — OXYCODONE HCL 5 MG PO TABS: 5.0000 mg | ORAL_TABLET | Freq: Four times a day (QID) | ORAL | Status: DC | PRN

## 2021-06-14 MED ORDER — LACTATED RINGERS IV BOLUS
1000.0000 mL | Freq: Once | INTRAVENOUS | Status: AC
  Administered 2021-06-14: 1000 mL via INTRAVENOUS

## 2021-06-14 MED ORDER — LISINOPRIL 10 MG PO TABS: 10.0000 mg | ORAL_TABLET | Freq: Every day | ORAL | Status: DC

## 2021-06-14 MED ORDER — ASPIRIN EC 325 MG PO TBEC
325.0000 mg | DELAYED_RELEASE_TABLET | Freq: Once | ORAL | Status: AC
  Administered 2021-06-14: 325 mg via ORAL
  Filled 2021-06-14: qty 1

## 2021-06-14 MED ORDER — LORAZEPAM 0.5 MG PO TABS
0.5000 mg | ORAL_TABLET | Freq: Four times a day (QID) | ORAL | Status: DC | PRN
Start: 2021-06-14 — End: 2021-06-15

## 2021-06-14 MED ORDER — ACETAMINOPHEN 500 MG PO TABS
1000.0000 mg | ORAL_TABLET | Freq: Once | ORAL | Status: AC
  Administered 2021-06-14: 1000 mg via ORAL
  Filled 2021-06-14: qty 2

## 2021-06-14 MED ORDER — ACETAMINOPHEN 325 MG PO TABS: 650.0000 mg | ORAL_TABLET | Freq: Four times a day (QID) | ORAL | Status: DC | PRN

## 2021-06-14 MED ORDER — ENOXAPARIN SODIUM 40 MG/0.4ML IJ SOSY
40.0000 mg | PREFILLED_SYRINGE | INTRAMUSCULAR | Status: DC
  Administered 2021-06-14: 40 mg via SUBCUTANEOUS
  Filled 2021-06-14: qty 0.4

## 2021-06-14 MED ORDER — GENTAMICIN SULFATE 40 MG/ML IJ SOLN
80.0000 mg | Freq: Once | INTRAMUSCULAR | Status: DC
  Administered 2021-06-14: 80 mg via INTRAMUSCULAR

## 2021-06-14 MED ORDER — SODIUM CHLORIDE 0.9% FLUSH
3.0000 mL | Freq: Two times a day (BID) | INTRAVENOUS | Status: DC
  Administered 2021-06-14 – 2021-06-15 (×2): 3 mL via INTRAVENOUS

## 2021-06-14 NOTE — ED Notes (Signed)
Teleneuro Activated.

## 2021-06-14 NOTE — ED Triage Notes (Signed)
Pt to ED from urology center. Pt was getting a biopsy for prostate when he starting having a seizure. Pt diaphoretic and labored breathing in triage. Wife states pt's speech appears slurred and pt states new onset of right leg numbness.

## 2021-06-14 NOTE — Patient Instructions (Signed)
Transrectal Ultrasound-Guided Prostate Biopsy, Care After What can I expect after the procedure? After the procedure, it is common to have: Pain and discomfort near your butt (rectum), especially while sitting. Pink-colored pee (urine). This is due to small amounts of blood in your pee. A burning feeling while peeing. Blood in your poop (stool). Bleeding from your butt. Blood in your semen. Follow these instructions at home: Medicines Take over-the-counter and prescription medicines only as told by your doctor. If you were given a sedative during your procedure, do not drive or use machines until your doctor says that it is safe. A sedative is a medicine that helps you relax. If you were prescribed an antibiotic medicine, take it as told by your doctor. Do not stop taking it even if you start to feel better. Activity  Return to your normal activities when your doctor says that it is safe. Ask your doctor when it is okay for you to have sex. You may have to avoid lifting. Ask your doctor how much you can safely lift. General instructions  Drink enough water to keep your pee pale yellow. Watch your pee, poop, and semen for new bleeding or bleeding that gets worse. Keep all follow-up visits. Contact a doctor if: You have any of these: Blood clots in your pee or poop. Blood in your pee more than 2 weeks after the procedure. Blood in your semen more than 2 months after the procedure. New or worse bleeding in your pee, poop, or semen. Very bad belly pain. Your pee smells bad or unusual. You have trouble peeing. Your lower belly feels firm. You have problems getting an erection. You feel like you may vomit (are nauseous), or you vomit. Get help right away if: You have a fever or chills. You have bright red pee. You have very bad pain that does not get better with medicine. You cannot pee. Summary After this procedure, it is common to have pain and discomfort near your butt,  especially while sitting. You may have blood in your pee and poop. It is common to have blood in your semen. Get help right away if you have a fever or chills. This information is not intended to replace advice given to you by your health care provider. Make sure you discuss any questions you have with your health care provider. Document Revised: 10/10/2020 Document Reviewed: 10/10/2020 Elsevier Patient Education  [employer].

## 2021-06-14 NOTE — ED Notes (Signed)
Lab at bedside to obtain bloodwork

## 2021-06-14 NOTE — ED Notes (Signed)
Lab called to obtain blood work.

## 2021-06-14 NOTE — H&P (Signed)
History and Physical    Howard Stafford DGU:[id number] DOB: July 22, 1945 DOA: 06/14/2021  PCP: Patient, No Pcp Per (Inactive) (Confirm with patient/family/NH records and if not entered, this has to be entered at [facility] point of entry) Patient coming from: Home  I have personally briefly reviewed patient's old medical records in [place]  Chief Complaint: Feeling weak  HPI: Howard Stafford is a 76 y.o. male with medical history significant of HTN (diagnosed 2 weeks ago), abnormal PSA level, who went to have prostate biopsy today.  After the procedure, patient lost consciousness for few seconds.  And patient was found to have bradycardia during the episode.  Patient recovered consciousness spontaneously, no postictal confusion.  Patient does not remember any prodrome such as lightheadedness blurry vision.  However while patient walking out of the room, patient started to feel lightheaded, staff at urology checked his vital signs found patient was bradycardia and hypotensive.  At the time I saw the patient in the ED, patient complaining about new onset of right leg weakness and numbness, "I can not even put weight on right leg and the whole right leg feels numb".  He denies any urine problem, no perineal numbness.  Denies any fever or chills  ED Course: Blood pressure borderline low, patient was given IV fluid 1000 bolus x1.    Neurology consulted, brain MRI MRI negative for stroke or major stenosis.  CT pelvis perirectal/presacral adenopathy suspicious for metastasis.  BBC 12, creatinine 1.1.  Review of Systems: As per HPI otherwise 14 point review of systems negative.    Past Medical History:  Diagnosis Date   Skin cancer face    Past Surgical History:  Procedure Laterality Date   BACK SURGERY     EYE SURGERY       reports that he has been smoking cigarettes. He has been smoking an average of 1 pack per day. He has been exposed to tobacco smoke. He has never used smokeless  tobacco. He reports current alcohol use. He reports that he does not use drugs.  No Known Allergies  No family history on file.   Prior to Admission medications   Medication Sig Start Date End Date Taking? Authorizing Provider  lisinopril (ZESTRIL) 10 MG tablet Take 10 mg by mouth daily. 06/05/21  Yes [provider]    Physical Exam: Vitals:   06/14/21 1509 06/14/21 1610 06/14/21 1630 06/14/21 1752  BP: (!) 92/54 115/60 114/61 110/62  Pulse: 64 63 71 77  Resp: 18 20 20 17   Temp: 98 F (36.7 C)     TempSrc: Oral     SpO2: 93% 98% 100% 99%    Constitutional: NAD, calm, comfortable Vitals:   06/14/21 1509 06/14/21 1610 06/14/21 1630 06/14/21 1752  BP: (!) 92/54 115/60 114/61 110/62  Pulse: 64 63 71 77  Resp: 18 20 20 17   Temp: 98 F (36.7 C)     TempSrc: Oral     SpO2: 93% 98% 100% 99%   Eyes: PERRL, lids and conjunctivae normal ENMT: Mucous membranes are moist. Posterior pharynx clear of any exudate or lesions.Normal dentition.  Neck: normal, supple, no masses, no thyromegaly Respiratory: clear to auscultation bilaterally, no wheezing, no crackles. Normal respiratory effort. No accessory muscle use.  Cardiovascular: Regular rate and rhythm, no murmurs / rubs / gallops. No extremity edema. 2+ pedal pulses. No carotid bruits.  Abdomen: no tenderness, no masses palpated. No hepatosplenomegaly. Bowel sounds positive.  Musculoskeletal: no clubbing / cyanosis. No joint  deformity upper and lower extremities. Good ROM, no contractures. Normal muscle tone.  Skin: no rashes, lesions, ulcers. No induration Neurologic: CN 2-12 grossly intact.  Decreased light touch sensation of right leg compared to the left, muscle strength 4/5 on right leg below the knee and right foot compared to the left.  Dragging right foot when walking.   Psychiatric: Normal judgment and insight. Alert and oriented x 3. Normal mood.    Labs on Admission: I have personally reviewed following labs and  imaging studies  CBC: Recent Labs  Lab 06/14/21 1511  WBC 12.3*  NEUTROABS 6.4  HGB 16.5  HCT 47.0  MCV 93.3  PLT 638   Basic Metabolic Panel: Recent Labs  Lab 06/14/21 1511  NA 135  K 3.6  CL 104  CO2 22  GLUCOSE 130*  BUN 17  CREATININE 1.13  CALCIUM 9.1  MG 2.3   GFR: Estimated Creatinine Clearance: 64.6 mL/min (by C-G formula based on SCr of 1.13 mg/dL). Liver Function Tests: Recent Labs  Lab 06/14/21 1511  AST 29  ALT 32  ALKPHOS 86  BILITOT 0.8  PROT 7.2  ALBUMIN 4.0   No results for input(s): LIPASE, AMYLASE in the last 168 hours. No results for input(s): AMMONIA in the last 168 hours. Coagulation Profile: Recent Labs  Lab 06/14/21 1511  INR 1.1   Cardiac Enzymes: No results for input(s): CKTOTAL, CKMB, CKMBINDEX, TROPONINI in the last 168 hours. BNP (last 3 results) No results for input(s): PROBNP in the last 8760 hours. HbA1C: No results for input(s): HGBA1C in the last 72 hours. CBG: Recent Labs  Lab 06/14/21 1507  GLUCAP 134*   Lipid Profile: Recent Labs    06/14/21 1511  CHOL 216*  HDL 34*  LDLCALC 158*  TRIG 121  CHOLHDL 6.4   Thyroid Function Tests: Recent Labs    06/14/21 1511  TSH 2.003   Anemia Panel: No results for input(s): VITAMINB12, FOLATE, FERRITIN, TIBC, IRON, RETICCTPCT in the last 72 hours. Urine analysis:    Component Value Date/Time   COLORURINE YELLOW 06/09/2021 0946   APPEARANCEUR CLEAR 06/09/2021 0946   LABSPEC 1.010 06/09/2021 0946   PHURINE 5.5 06/09/2021 0946   GLUCOSEU NEGATIVE 06/09/2021 0946   HGBUR TRACE (A) 06/09/2021 0946   BILIRUBINUR NEGATIVE 06/09/2021 0946   KETONESUR NEGATIVE 06/09/2021 0946   PROTEINUR NEGATIVE 06/09/2021 0946   NITRITE NEGATIVE 06/09/2021 0946   LEUKOCYTESUR NEGATIVE 06/09/2021 0946    Radiological Exams on Admission: CT PELVIS W CONTRAST  Result Date: 06/14/2021 CLINICAL DATA:  ?sepsis and R hip pain s/p prostate biopsy earlier today EXAM: CT PELVIS WITH  CONTRAST TECHNIQUE: Multidetector CT imaging of the pelvis was performed using the standard protocol following the bolus administration of intravenous contrast. RADIATION DOSE REDUCTION: This exam was performed according to the departmental dose-optimization program which includes automated exposure control, adjustment of the mA and/or kV according to patient size and/or use of iterative reconstruction technique. CONTRAST:  178mL OMNIPAQUE IOHEXOL 300 MG/ML  SOLN COMPARISON:  None. FINDINGS: Urinary Tract:  No abnormality visualized. Bowel: The appendix is normal. Under distended rectum. Unremarkable visualized pelvic bowel loops. Vascular/Lymphatic: Severe calcified and noncalcified atherosclerotic plaque. Enlarged left common iliac artery measuring up to 1.6 cm. Left posterior enlarged perirectal lymph node measuring 1.2 cm. Multiple other perirectal lymph nodes noted along the presacral region (5: 57-67): As an example a 1.4 cm lymph node (2:26). No significant vascular abnormality seen. Reproductive: The prostate is unremarkable. Moderate to large volume right  hydrocele. Small left hydrocele. Other: No intraperitoneal free fluid. No intraperitoneal free gas. No organized fluid collection. Musculoskeletal: Multilevel severe degenerative changes of the visualized lower lumbar spine. Mild retrolisthesis of L5 on S1. IMPRESSION: 1. Perirectal/presacral lymphadenopathy. 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 2. Under distended rectum with limited evaluation. 3. Moderate to large volume right hydrocele. 4. Small left hydrocele. 5. Aortic Atherosclerosis (ICD10-I70.0). 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 Electronically Signed   By: Iven Finn M.D.   On: 06/14/2021 17:11   MR ANGIO HEAD WO CONTRAST  Result Date: 06/14/2021 CLINICAL DATA:  Seizure EXAM: MRI HEAD WITHOUT AND WITH CONTRAST MRA HEAD WITHOUT CONTRAST MRA NECK WITHOUT AND WITH CONTRAST TECHNIQUE: Multiplanar, multiecho pulse sequences of the  brain and surrounding structures were obtained without and with intravenous contrast. Angiographic images of the Circle of Willis were obtained using MRA technique without intravenous contrast. Angiographic images of the neck were obtained using MRA technique without and with intravenous contrast. Carotid stenosis measurements (when applicable) are obtained utilizing NASCET criteria, using the distal internal carotid diameter as the denominator. CONTRAST:  31mL GADAVIST GADOBUTROL 1 MMOL/ML IV SOLN COMPARISON:  None. FINDINGS: MRI HEAD FINDINGS Brain: No acute infarct, mass effect or extra-axial collection. No acute or chronic hemorrhage. There is multifocal hyperintense T2-weighted signal within the white matter. Parenchymal volume and CSF spaces are normal. The midline structures are normal. Vascular: Major flow voids are preserved. Skull and upper cervical spine: Normal calvarium and skull base. Visualized upper cervical spine and soft tissues are normal. Sinuses/Orbits:No paranasal sinus fluid levels or advanced mucosal thickening. No mastoid or middle ear effusion. Normal orbits. MRA HEAD FINDINGS POSTERIOR CIRCULATION: --Vertebral arteries: Normal --Inferior cerebellar arteries: Normal. --Basilar artery: Normal. --Superior cerebellar arteries: Normal. --Posterior cerebral arteries: Normal. ANTERIOR CIRCULATION: --Intracranial internal carotid arteries: Normal. --Anterior cerebral arteries (ACA): Normal. --Middle cerebral arteries (MCA): Normal. ANATOMIC VARIANTS: None MRA NECK FINDINGS Normal carotid and vertebral systems. IMPRESSION: 1. No acute intracranial abnormality. 2. Normal MRA of the head and neck. 3. Mild chronic small vessel disease. Electronically Signed   By: Ulyses Jarred M.D.   On: 06/14/2021 19:30   MR ANGIO NECK W WO CONTRAST  Result Date: 06/14/2021 CLINICAL DATA:  Seizure EXAM: MRI HEAD WITHOUT AND WITH CONTRAST MRA HEAD WITHOUT CONTRAST MRA NECK WITHOUT AND WITH CONTRAST TECHNIQUE:  Multiplanar, multiecho pulse sequences of the brain and surrounding structures were obtained without and with intravenous contrast. Angiographic images of the Circle of Willis were obtained using MRA technique without intravenous contrast. Angiographic images of the neck were obtained using MRA technique without and with intravenous contrast. Carotid stenosis measurements (when applicable) are obtained utilizing NASCET criteria, using the distal internal carotid diameter as the denominator. CONTRAST:  50mL GADAVIST GADOBUTROL 1 MMOL/ML IV SOLN COMPARISON:  None. FINDINGS: MRI HEAD FINDINGS Brain: No acute infarct, mass effect or extra-axial collection. No acute or chronic hemorrhage. There is multifocal hyperintense T2-weighted signal within the white matter. Parenchymal volume and CSF spaces are normal. The midline structures are normal. Vascular: Major flow voids are preserved. Skull and upper cervical spine: Normal calvarium and skull base. Visualized upper cervical spine and soft tissues are normal. Sinuses/Orbits:No paranasal sinus fluid levels or advanced mucosal thickening. No mastoid or middle ear effusion. Normal orbits. MRA HEAD FINDINGS POSTERIOR CIRCULATION: --Vertebral arteries: Normal --Inferior cerebellar arteries: Normal. --Basilar artery: Normal. --Superior cerebellar arteries: Normal. --Posterior cerebral arteries: Normal. ANTERIOR CIRCULATION: --Intracranial internal carotid arteries: Normal. --Anterior cerebral arteries (ACA): Normal. --Middle cerebral arteries (MCA): Normal. ANATOMIC  VARIANTS: None MRA NECK FINDINGS Normal carotid and vertebral systems. IMPRESSION: 1. No acute intracranial abnormality. 2. Normal MRA of the head and neck. 3. Mild chronic small vessel disease. Electronically Signed   By: Ulyses Jarred M.D.   On: 06/14/2021 19:30   MR BRAIN W WO CONTRAST  Result Date: 06/14/2021 CLINICAL DATA:  Seizure EXAM: MRI HEAD WITHOUT AND WITH CONTRAST MRA HEAD WITHOUT CONTRAST MRA NECK  WITHOUT AND WITH CONTRAST TECHNIQUE: Multiplanar, multiecho pulse sequences of the brain and surrounding structures were obtained without and with intravenous contrast. Angiographic images of the Circle of Willis were obtained using MRA technique without intravenous contrast. Angiographic images of the neck were obtained using MRA technique without and with intravenous contrast. Carotid stenosis measurements (when applicable) are obtained utilizing NASCET criteria, using the distal internal carotid diameter as the denominator. CONTRAST:  62mL GADAVIST GADOBUTROL 1 MMOL/ML IV SOLN COMPARISON:  None. FINDINGS: MRI HEAD FINDINGS Brain: No acute infarct, mass effect or extra-axial collection. No acute or chronic hemorrhage. There is multifocal hyperintense T2-weighted signal within the white matter. Parenchymal volume and CSF spaces are normal. The midline structures are normal. Vascular: Major flow voids are preserved. Skull and upper cervical spine: Normal calvarium and skull base. Visualized upper cervical spine and soft tissues are normal. Sinuses/Orbits:No paranasal sinus fluid levels or advanced mucosal thickening. No mastoid or middle ear effusion. Normal orbits. MRA HEAD FINDINGS POSTERIOR CIRCULATION: --Vertebral arteries: Normal --Inferior cerebellar arteries: Normal. --Basilar artery: Normal. --Superior cerebellar arteries: Normal. --Posterior cerebral arteries: Normal. ANTERIOR CIRCULATION: --Intracranial internal carotid arteries: Normal. --Anterior cerebral arteries (ACA): Normal. --Middle cerebral arteries (MCA): Normal. ANATOMIC VARIANTS: None MRA NECK FINDINGS Normal carotid and vertebral systems. IMPRESSION: 1. No acute intracranial abnormality. 2. Normal MRA of the head and neck. 3. Mild chronic small vessel disease. Electronically Signed   By: Ulyses Jarred M.D.   On: 06/14/2021 19:30   DG Chest Port 1 View  Result Date: 06/14/2021 CLINICAL DATA:  Questionable sepsis.  Evaluate for abnormality.  EXAM: PORTABLE CHEST 1 VIEW COMPARISON:  None. FINDINGS: Cardiac silhouette is at the upper limits of normal size. Mediastinal contours are within normal limits. The lungs are clear. No pleural effusion or pneumothorax. Mild multilevel degenerative disc changes of the thoracic spine. Moderate degenerative changes of the bilateral acromioclavicular joints. IMPRESSION: No active disease. Electronically Signed   By: Yvonne Kendall M.D.   On: 06/14/2021 17:23   CT HEAD CODE STROKE WO CONTRAST  Result Date: 06/14/2021 CLINICAL DATA:  Code stroke.  Stroke, follow up EXAM: CT HEAD WITHOUT CONTRAST TECHNIQUE: Contiguous axial images were obtained from the base of the skull through the vertex without intravenous contrast. RADIATION DOSE REDUCTION: This exam was performed according to the departmental dose-optimization program which includes automated exposure control, adjustment of the mA and/or kV according to patient size and/or use of iterative reconstruction technique. COMPARISON:  12/01/2004. FINDINGS: Brain: No evidence of acute large vascular territory infarction, hemorrhage, hydrocephalus, extra-axial collection or mass lesion/mass effect. Patchy white matter hypoattenuation, nonspecific but compatible with chronic microvascular ischemic disease. Vascular: No hyperdense vessel identified. Calcific intracranial atherosclerosis. Skull: No acute fracture. Sinuses/Orbits: Mild paranasal sinus mucosal thickening. Unremarkable orbits. Other: No mastoid effusions. ASPECTS [facility] Stroke Program Early CT Score) total score (0-10 with 10 being normal): 10. IMPRESSION: 1. No evidence of acute large vascular territory infarct or acute hemorrhage. ASPECTS is 10. 2. Chronic microvascular disease. Code stroke imaging results were communicated on 06/14/2021 at 3:32 pm to provider [facility] via secure text  paging. Electronically Signed   By: Margaretha Sheffield M.D.   On: 06/14/2021 15:33    EKG: Independently reviewed.  Sinus, no  acute ST changes.  Assessment/Plan Principal Problem:   Syncope Active Problems:   Syncope, vasovagal  (please populate well all problems here in Problem List. (For example, if patient is on BP meds at home and you resume or decide to hold them, it is a problem that needs to be her. Same for CAD, COPD, HLD and so on)  Syncope, vasovagal -Stroke ruled out. -Etiology likely prostate biopsy stimulated vagal reaction.  Question of whether he is overmedicated for BP meds.  As lisinopril was just started 2 weeks ago. -Hold off lisinopril, check orthostatic vital signs. -Other etiology, MRI not showing brain mets, and clinical course less compatible with seizure disorder.  Blood culture drawn to rule out bacteremia.  Right foot drop -Given the finding of CT pelvis showing sacral lymphadenopathy, and likely underlying prostate cancer, concern about L5-S1 level pathology.  Ordered lumbar spine MRI.  And PT evaluation.  Leukocytosis -Probably related to biopsy and stress.  No other systemic inflammation signs, hold off antibiotics.  Patient did receive 1 dose of Levaquin before the procedure today.  HTN -Hold BP meds for now.    DVT prophylaxis: Lovenox Code Status: Full code Family Communication: Wife at bedside Disposition Plan: Expect less than 2 midnight hospital stay Consults called: Neurology Admission status: Telemetry observation   Lequita Halt MD [facility] Pager [phone]  06/14/2021, 8:09 PM

## 2021-06-14 NOTE — ED Notes (Signed)
Pt's belongings are with wife.

## 2021-06-14 NOTE — ED Notes (Signed)
Code  stroke  called  to  jamie  at  [place]

## 2021-06-14 NOTE — ED Notes (Signed)
Pt had a large BM. RN and Nurse Tech did a full bed lien change. Pericare was down and pt was given warm blankets.

## 2021-06-14 NOTE — ED Notes (Signed)
Admitting MD at bedside.

## 2021-06-14 NOTE — ED Notes (Signed)
Pt soiled himself. RN did a full bed lien change and pericare was done. Pt was placed in hospital gown with grey non-skid socks. Pt's clothes were placed in belongings bag and given to pt's wife.

## 2021-06-14 NOTE — ED Notes (Signed)
Patient transported to CT 

## 2021-06-14 NOTE — Progress Notes (Signed)
°   06/14/21 1500  Clinical Encounter Type  Visited With Patient and family together  Visit Type Initial  Referral From Physician  Consult/Referral To Chaplain  Recommendations family support   Chaplain responded to code stroke. Chaplain spoke with wife while team worked on patient. Chaplain provided emotional support to family member. Chaplain will follow up after testing is complete.

## 2021-06-14 NOTE — ED Notes (Signed)
Patient transported to MRI 

## 2021-06-14 NOTE — Progress Notes (Signed)
Cross Cover Admitted new onset seizures. No prior magnesium level. Magnesium level added to previous blood collection

## 2021-06-14 NOTE — Consult Note (Signed)
Neurology Consult H&P  Howard Stafford MR# [id number] 06/14/2021   CC: right lower extremity pain and numbness  History is obtained from: patient, ED staff and chart.  HPI: Howard Stafford is a 76 y.o. male PMHx as reviewed below underwent prostate biopsy and during procedure had seizure like activity and was transferred to ED where he was diaphoretic with labored breathing in triage and noted to have right lower extremity numbness and pain. Pain in right leg described as dull throbbing.  The following information was taken from urology procedure note 06/14/2021 :  Pre-Procedure: "...-Transrectal Ultrasound performed... - Prostate block performed using 10 cc 1% lidocaine and biopsies taken from sextant areas, a total of 12 under ultrasound guidance.    Post-Procedure: - He had a vasovagal/syncopal episode urinary incontinence and diaphoresis that lasted about 2 minute, incontinent of urine during this time but ultimately became alert, attentive.  He had good peripheral pulses throughout the event and was respiring at a normal rate.   - He was counseled to seek immediate medical attention if experiences any severe pain, significant bleeding, or fevers - Return in one week to discuss biopsy results .Marland KitchenMarland KitchenAddendum: At the patient was being transported to his car in a wheelchair, he had another second episode of diaphoresis, lightheadedness, nausea with vomiting and fecal incontinence.  He became cold and clammy.  He was brought back into our office at which time he was satting well, 96% on room air, pulse in the 70s however his blood pressure was still low at 80/66 and as such, given the second presyncopal episode, he was taken to the emergency room for further evaluation.Marland KitchenMarland KitchenHe was taken by our staff via wheelchair."  LKW: ~1450 tNK given: No mild symptoms IR Thrombectomy No, not indicated Modified Rankin Scale: 0-Completely asymptomatic and back to baseline post- stroke NIHSS: 1  ROS: A  complete ROS was performed and is negative except as noted in the HPI.   Past Medical History:  Diagnosis Date   Skin cancer face   No family history on file.  Social History:  reports that he has been smoking cigarettes. He has been smoking an average of 1 pack per day. He has been exposed to tobacco smoke. He has never used smokeless tobacco. He reports current alcohol use. He reports that he does not use drugs.   Prior to Admission medications   Medication Sig Start Date End Date Taking? Authorizing Provider  lisinopril (ZESTRIL) 10 MG tablet Take 10 mg by mouth daily. 06/05/21   [provider]    Exam: Current vital signs: BP (!) 92/54 (BP Location: Left Arm)    Pulse 64    Temp 98 F (36.7 C) (Oral)    Resp 18    SpO2 93%   Physical Exam  Constitutional: Appears well-developed and well-nourished.  Psych: Affect appropriate to situation Eyes: No scleral injection HENT: No OP obstruction. Head: Normocephalic.  Cardiovascular: Normal rate and regular rhythm.  Respiratory: Effort normal, symmetric excursions bilaterally, no audible wheezing. GI: Soft.  No distension. There is no tenderness.  Skin: WDI  Neuro: Mental Status: Patient is awake, alert, oriented to person, place, month, year, and situation. Patient is able to give a clear and coherent history. Speech fluent, intact comprehension and repetition. No signs of aphasia or neglect. Visual Fields are full. Pupils are equal, round, and reactive to light. EOMI without ptosis or diplopia.  Facial sensation is symmetric to temperature Facial movement is symmetric.  Hearing is intact to voice.  Uvula midline and palate elevates symmetrically. Shoulder shrug is symmetric. Tongue is midline without atrophy or fasciculations.  Tone is normal. Bulk is normal. 5/5 strength was present in all four extremities. Sensation decreased in right lower extremity to light touch and temperature in the arms and  legs.  Tenderness to palpation right lateral thigh  Deep Tendon Reflexes: 2+ and symmetric in the biceps and patellae. Toes are downgoing bilaterally. FNF and HKS are intact bilaterally. Gait - Deferred  I have reviewed labs in epic and the pertinent results are: No labs available.  I have reviewed the images obtained: NCT head showed no acute ischemic changes, hemorrhage or mass.  Assessment: Howard Stafford is a 76 y.o. male PMHx as noted above underwent transrectal [place] with prostate block w/10 cc 1% lidocaine s/p 12 biopsies with 2 paroxysmal events. By description in setting of hypotension likely convulsive syncope and not likely to be stroke. He did complain of numbness in his entire right leg with throbbing pain 6-7/10. On exam there was tenderness in the L2-3 myotomes. The extent/approach of the periprostatic nerve block is not clear however he did complain of lower abdominal pain which may be seen post procedure.   Recommended aspirin 324mg  now.  Plan: - Cancel code stroke. - May need to discuss with urology for more information. - Neurology will remain available, please call for questions.   This patient is critically ill and at significant risk of neurological worsening, death and care requires constant monitoring of vital signs, hemodynamics,respiratory and cardiac monitoring, neurological assessment, discussion with family, other specialists and medical decision making of high complexity. I spent 71 minutes of neurocritical care time  in the care of  this patient. This was time spent independent of any time provided by nurse practitioner or PA.  Electronically signed by:  Lynnae Sandhoff, MD Page: [phone] 06/14/2021, 3:40 PM  If 7pm- 7am, please page neurology on call as listed in [place].

## 2021-06-14 NOTE — ED Provider Notes (Signed)
[facility] Provider Note    Event Date/Time   First MD Initiated Contact with Patient 06/14/21 1513     (approximate)   History   Seizures and Code Stroke   HPI  Howard Stafford is a 76 y.o. male with a past medical history of basal cell carcinoma and recent prostate biopsy for enlarged prostate earlier today and following this had some seizure-like activity.  This was followed by seemingly normal return to consciousness but then patient had a second episode associated with some lightheadedness diaphoresis nausea vomiting and fecal incontinence.  He was noted to be hypotensive prompting transfer to the ED for further evaluation. This was associated with some urinary incontinence.  Patient states he does not remember exactly what happened and is unclear how long this lasted.  He did not get any other medications prior to arrival other than empiric antibiotics associate with his procedure.  He states right now the only thing that is bothering him his pain in his right hip and suprapubic region.  Per patient and wife at bedside he was in his usual state health before today's biopsy without any other recent headache, fevers, cough, chest pain, vomiting, diarrhea, pain in the right hip or any other acute sick symptoms.  He does not have any other medical problems he is aware of.  He has never had a seizure or stroke.  He is not on any blood thinners.     Past Medical History:  Diagnosis Date   Skin cancer face     Physical Exam  Triage Vital Signs: ED Triage Vitals [06/14/21 1509]  Enc Vitals Group     BP (!) 92/54     Pulse Rate 64     Resp 18     Temp 98 F (36.7 C)     Temp Source Oral     SpO2 93 %     Weight      Height      Head Circumference      Peak Flow      Pain Score      Pain Loc      Pain Edu?      Excl. in [place]?     Most recent vital signs: Vitals:   06/14/21 1610 06/14/21 1630  BP: 115/60 114/61  Pulse: 63 71  Resp: 20 20   Temp:    SpO2: 98% 100%    General: Awake, appears uncomfortable. CV:  Good peripheral perfusion.  Plus radial pulses with no murmurs rubs or gallops. Resp:  Normal effort.  Clear bilaterally Abd:  No distention.  Mild suprapubic tenderness but otherwise soft throughout.  No significant CVA tenderness. Other:  Cranial nerves II through XII are grossly intact.  Patient is symmetric strength in the bilateral upper and lower extremities.  Full strength of the left lower extremity.  He is weak in the right hip on flexion extension although somewhat difficult to ascertain if this is pain related or not.  He does have full strength of the right knee and ankle.  Sensation is intact to light touch in all extremities.  2+ DP pulses.  No significant overlying skin changes over the right hip and his scrotum and testes and penile shaft are all unremarkable.  There are some brown stool in the rectum but internal exam was deferred.   ED Results / Procedures / Treatments  Labs (all labs ordered are listed, but only abnormal results are displayed) Labs Reviewed  CBC -  Abnormal; Notable for the following components:      Result Value   WBC 12.3 (*)    All other components within normal limits  DIFFERENTIAL - Abnormal; Notable for the following components:   Lymphs Abs 4.5 (*)    All other components within normal limits  COMPREHENSIVE METABOLIC PANEL - Abnormal; Notable for the following components:   Glucose, Bld 130 (*)    All other components within normal limits  CBG MONITORING, ED - Abnormal; Notable for the following components:   Glucose-Capillary 134 (*)    All other components within normal limits  RESP PANEL BY RT-PCR (FLU A&B, COVID) ARPGX2  CULTURE, BLOOD (ROUTINE X 2)  CULTURE, BLOOD (ROUTINE X 2)  ETHANOL  PROTIME-INR  APTT  URINE DRUG SCREEN, QUALITATIVE ([facility] ONLY)  LACTIC ACID, PLASMA  LACTIC ACID, PLASMA  PROCALCITONIN  URINALYSIS, COMPLETE (UACMP) WITH MICROSCOPIC   HEMOGLOBIN A1C  TSH  LIPID PANEL  TROPONIN I (HIGH SENSITIVITY)     EKG  ECG shows sinus rhythm with a ventricular rate of 65, normal axis, unremarkable intervals without evidence of acute ischemia or significant rhythm.   RADIOLOGY CT head obtained without contrast on my interpretation shows no acute hemorrhage or large area of ischemia or other clear acute process.  I also reviewed radiology dictation and agree with their interpretation of no acute process but some evidence of chronic microvascular ischemic changes.   CT pelvis obtained by myself shows no right hip orthopedic or injury, abscess, significant free abdominal fluid, does show some increased presacral lymphadenopathy.  I also reviewed radiologist interpretation and agree with their findings of moderate large right hydrocele and small left hydrocele as well as aortic atherosclerosis and left common iliac artery aneurysm.  No other acute process noted per radiology although they do note that the lymphadenopathy is concerning for possible metastatic disease..  Chest reviewed by myself shows no focal consoidation, effusion, edema, pneumothorax or other clear acute thoracic process. I also reviewed radiology interpretation and agree with findings described.    PROCEDURES:  Critical Care performed: No  .1-3 Lead EKG Interpretation Performed by: Lucrezia Starch, MD Authorized by: Lucrezia Starch, MD     Interpretation: normal     ECG rate assessment: normal     Rhythm: sinus rhythm     Ectopy: none     Conduction: normal    The patient is on the cardiac monitor to evaluate for evidence of arrhythmia and/or significant heart rate changes.   MEDICATIONS ORDERED IN ED: Medications  lactated ringers bolus 1,000 mL (has no administration in time range)  acetaminophen (TYLENOL) tablet 1,000 mg (has no administration in time range)  aspirin EC tablet 325 mg (325 mg Oral Given 06/14/21 1545)  iohexol (OMNIPAQUE) 300  MG/ML solution 100 mL (100 mLs Intravenous Contrast Given 06/14/21 1648)     IMPRESSION / MDM / ASSESSMENT AND PLAN / ED COURSE  I reviewed the triage vital signs and the nursing notes.                              Differential diagnosis includes, but is not limited to CVA, convulsive syncope versus seizure possible pain in right hip related to a biopsy including sepsis, hematoma versus other postprocedural complication.  With regard to possible syncope differential considerations include arrhythmia, anemia, vasovagal, orthostatic, ACS with low suspicion for PE or acute cardiomyopathy at this time.  Patient made code stroke in  triage as he is also complains of numbness in the right hip although he is able to feel his examiner throughout the right lower extremity.  He was seen immediately by neurology and underwent a CT Noncon head.   ECG shows sinus rhythm with a ventricular rate of 65, normal axis, unremarkable intervals without evidence of acute ischemia or significant rhythm.  Nonelevated troponin, ECG is not suggestive of ACS.  CMP shows no significant electrolyte or metabolic derangements.  CBC shows WBC count of 12.3 with normal hemoglobin and platelets.  INR is unremarkable.  Serum ethanol is undetectable.  COVID and influenza PCR is negative.  CT head obtained without contrast on my interpretation shows no acute hemorrhage or large area of ischemia or other clear acute process.  I also reviewed radiology dictation and agree with their interpretation of no acute process but some evidence of chronic microvascular ischemic changes.  Chest reviewed by myself shows no focal consoidation, effusion, edema, pneumothorax or other clear acute thoracic process. I also reviewed radiology interpretation and agree with findings described.  CT pelvis obtained by myself shows no right hip orthopedic or injury, abscess, significant free abdominal fluid, does show some increased presacral lymphadenopathy.   I also reviewed radiologist interpretation and agree with their findings of moderate large right hydrocele and small left hydrocele as well as aortic atherosclerosis and left common iliac artery aneurysm.  No other acute process noted per radiology although they do note that the lymphadenopathy is concerning for possible metastatic disease.  Discussed patient's presentation and work-up with neurologist who recommends MRI brain as well as vessel imaging and admission for further stroke work-up including TTE.  I also discussed with neurologist Dr. Erlene Quan who was involved in patient's biopsy earlier today and notes the patient likely has prostate cancer with CT today concerning for metastatic spread.  She feels the patient had likely a vasovagal event in the setting of his procedure.  On review of the records and after discussion with Dr. Vista Lawman he did receive some empiric antibiotics including Levaquin and gentamicin.  Patient's history and exam and work-up overall is most concerning for vasovagal episode in the setting procedure.  However given I cannot completely exclude a CVA neurology recommends admission for stroke work-up will admit for this.  These orders were placed.  I discussed this with admitting hospitalist who will admit patient.  While patient was briefly noted be hypotensive in clinic and borderline hypotensive here with slightly elevated white blood cell count suspect this is related to the vasovagal episode with some nausea and vomiting.  Cannot completely exclude sepsis and so blood cultures were obtained.  Lactic acid is not elevated.  He otherwise has had normal blood pressures on several reassessments and appears euvolemic.       FINAL CLINICAL IMPRESSION(S) / ED DIAGNOSES   Final diagnoses:  Prostate cancer ([place])  Syncope, unspecified syncope type  SIRS (systemic inflammatory response syndrome) ([place])     Rx / DC Orders   ED Discharge Orders     None        Note:   This document was prepared using Dragon voice recognition software and may include unintentional dictation errors.   Lucrezia Starch, MD 06/14/21 [phone]

## 2021-06-15 ENCOUNTER — Observation Stay
Admit: 2021-06-15 | Discharge: 2021-06-15 | Disposition: A | Discharge status: Home / Self Care | Payer: Medicare Other | Attending: Internal Medicine | Admitting: Internal Medicine

## 2021-06-15 DIAGNOSIS — R972 Elevated prostate specific antigen [PSA]: Secondary | ICD-10-CM

## 2021-06-15 DIAGNOSIS — R55 Syncope and collapse: Secondary | ICD-10-CM | POA: Diagnosis not present

## 2021-06-15 DIAGNOSIS — I951 Orthostatic hypotension: Secondary | ICD-10-CM | POA: Diagnosis not present

## 2021-06-15 DIAGNOSIS — K625 Hemorrhage of anus and rectum: Secondary | ICD-10-CM

## 2021-06-15 DIAGNOSIS — M21371 Foot drop, right foot: Secondary | ICD-10-CM

## 2021-06-15 LAB — ECHOCARDIOGRAM COMPLETE
AR max vel: 3.36 cm2
AV Area VTI: 4.16 cm2
AV Area mean vel: 3.54 cm2
AV Mean grad: 3 mmHg
AV Peak grad: 5.9 mmHg
Ao pk vel: 1.21 m/s
Area-P 1/2: 3.5 cm2
Height: 70 in
MV VTI: 3.52 cm2
S' Lateral: 3 cm
Weight: 3255.75 oz

## 2021-06-15 LAB — CBC
HCT: 42.6 % (ref 39.0–52.0)
Hemoglobin: 15 g/dL (ref 13.0–17.0)
MCH: 32.1 pg (ref 26.0–34.0)
MCHC: 35.2 g/dL (ref 30.0–36.0)
MCV: 91.2 fL (ref 80.0–100.0)
Platelets: 171 10*3/uL (ref 150–400)
RBC: 4.67 MIL/uL (ref 4.22–5.81)
RDW: 12 % (ref 11.5–15.5)
WBC: 13.4 10*3/uL — ABNORMAL HIGH (ref 4.0–10.5)
nRBC: 0 % (ref 0.0–0.2)

## 2021-06-15 LAB — GLUCOSE, CAPILLARY: Glucose-Capillary: 123 mg/dL — ABNORMAL HIGH (ref 70–99)

## 2021-06-15 MED ORDER — SODIUM CHLORIDE 0.9 % IV BOLUS
500.0000 mL | Freq: Once | INTRAVENOUS | Status: AC
  Administered 2021-06-15: 12:00:00 500 mL via INTRAVENOUS

## 2021-06-15 MED ORDER — LEVOFLOXACIN 500 MG PO TABS
500.0000 mg | ORAL_TABLET | Freq: Every day | ORAL | Status: DC
  Administered 2021-06-15: 12:00:00 500 mg via ORAL
  Filled 2021-06-15: qty 1

## 2021-06-15 NOTE — Assessment & Plan Note (Signed)
The patient had 2 episodes of syncope after prostate biopsy.  The patient is feeling well today and wants to go home.  Echocardiogram normal.  MRI of the brain negative for stroke.  MRA angio head and neck negative.

## 2021-06-15 NOTE — Evaluation (Signed)
Physical Therapy Evaluation Patient Details Name: Howard Stafford MRN: [id number] DOB: 1946-04-07 Today's Date: 06/15/2021  History of Present Illness  76 y.o. male with a past medical history of basal cell carcinoma and recent prostate biopsy for enlarged prostate earlier today and following this had some seizure-like activity.  This was followed by seemingly normal return to consciousness but then patient had a second episode associated with some lightheadedness diaphoresis nausea vomiting and fecal incontinence.  Clinical Impression  Pt feeling essentially at baseline for PT eval, he has been able to ambulation in the room (to bathroom multiple times) with no AD or reported issues, did well with mobility and ambulation with PT exam.  Also showed = bilateral LE strength and reports no numbness, etc.  Pt does not need further PT f/u, will complete PT orders.      Recommendations for follow up therapy are one component of a multi-disciplinary discharge planning process, led by the attending physician.  Recommendations may be updated based on patient status, additional functional criteria and insurance authorization.  Follow Up Recommendations No PT follow up    Assistance Recommended at Discharge None  Patient can return home with the following       Equipment Recommendations None recommended by PT  Recommendations for Other Services       Functional Status Assessment Patient has not had a recent decline in their functional status     Precautions / Restrictions Precautions Precautions: Fall Restrictions Weight Bearing Restrictions: No      Mobility  Bed Mobility Overal bed mobility: Independent                  Transfers Overall transfer level: Independent                 General transfer comment: easily rises to standing w/o AD or safety issues    Ambulation/Gait Ambulation/Gait assistance: Independent   Assistive device: None         General Gait  Details: able to ambulate well in the room, able to perform heel-toe and retro ambulation w/o UE support or issue.  Pt confident and safe with all dynamic standing tasks  Stairs            Wheelchair Mobility    Modified Rankin (Stroke Patients Only)       Balance Overall balance assessment: Modified Independent (able to SLS ~5 sec b/l, heel toe ambulation, no Trendelenberg with NBOS and eyes close - small perturbations with appropriate righting.  No balance safety issues)                                           Pertinent Vitals/Pain Pain Assessment Pain Assessment: No/denies pain    Home Living Family/patient expects to be discharged to:: Private residence Living Arrangements: Spouse/significant other Available Help at Discharge: Family;Available PRN/intermittently Type of Home: House           Home Equipment: None      Prior Function Prior Level of Function : Independent/Modified Independent;Working/employed (Horticulturist, commercial, independent with all community tasks)                     Journalist, newspaper        Extremity/Trunk Assessment   Upper Extremity Assessment Upper Extremity Assessment: Overall WFL for tasks assessed    Lower Extremity Assessment Lower Extremity Assessment: Overall [facility]  for tasks assessed (= b/l, no residual R sided numbness/foot drop/etc)       Communication   Communication: No difficulties  Cognition Arousal/Alertness: Awake/alert Behavior During Therapy: WFL for tasks assessed/performed Overall Cognitive Status: Within Functional Limits for tasks assessed                                          General Comments      Exercises     Assessment/Plan    PT Assessment Patient does not need any further PT services  PT Problem List         PT Treatment Interventions      PT Goals (Current goals can be found in the Care Plan section)  Acute Rehab PT Goals Patient Stated Goal: Go  home PT Goal Formulation: All assessment and education complete, DC therapy    Frequency       Co-evaluation               AM-PAC PT "6 Clicks" Mobility  Outcome Measure Help needed turning from your back to your side while in a flat bed without using bedrails?: None Help needed moving from lying on your back to sitting on the side of a flat bed without using bedrails?: None Help needed moving to and from a bed to a chair (including a wheelchair)?: None Help needed standing up from a chair using your arms (e.g., wheelchair or bedside chair)?: None Help needed to walk in hospital room?: None Help needed climbing 3-5 steps with a railing? : None 6 Click Score: 24    End of Session   Activity Tolerance: Patient tolerated treatment well Patient left: in bed;with call bell/phone within reach;with family/visitor present Nurse Communication: Mobility status PT Visit Diagnosis: Unsteadiness on feet (R26.81);Dizziness and giddiness (R42)    Time: 1950-9326 PT Time Calculation (min) (ACUTE ONLY): 12 min   Charges:   PT Evaluation $PT Eval Low Complexity: 1 Low          Kreg Shropshire, DPT 06/15/2021, 12:29 PM

## 2021-06-15 NOTE — Assessment & Plan Note (Signed)
Had prostate biopsy yesterday and results are pending.  CT scan of the pelvis showed perirectal and presacral lymphadenopathy concerning for metastases.  Await biopsy result and follow-up with urology as outpatient.

## 2021-06-15 NOTE — Progress Notes (Signed)
*  PRELIMINARY RESULTS* Echocardiogram 2D Echocardiogram has been performed.  Howard Stafford 06/15/2021, 11:35 AM

## 2021-06-15 NOTE — Assessment & Plan Note (Addendum)
Completely resolved.  Held off on further imaging.  Did well with physical therapy.  The patient did have a right buttock antibiotic injection yesterday.  Not sure if this briefly affected nerve in his right leg.  Either way, his right leg symptoms have resolved.

## 2021-06-15 NOTE — Assessment & Plan Note (Addendum)
Secondary to prostate biopsy.  Case discussed with Dr. Erlene Quan and this is normal and this should settle down.  Follow-up as outpatient with Dr. Erlene Quan.  The patient also received an aspirin and a dose of Lovenox for DVT prophylaxis which were both discontinued.

## 2021-06-15 NOTE — Assessment & Plan Note (Signed)
The patient's blood pressure did drop while standing but felt okay.  I did give a fluid bolus here.  We will hold lisinopril.  Check orthostatic vital signs and follow-up appointment.

## 2021-06-15 NOTE — Discharge Summary (Signed)
Physician Discharge Summary   Patient: Howard Stafford MRN: [id number] DOB: 08-16-1945  Admit date:     06/14/2021  Discharge date: 06/15/21  Discharge Physician: Loletha Grayer   PCP: Dr. Benita Stabile  Recommendations at discharge:   Follow-up PCP 5 days Follow-up with Dr. Erlene Quan urology as scheduled  Discharge Diagnoses: Principal Problem:   Syncope, vasovagal Active Problems:   Orthostatic hypotension   Elevated PSA   Rectal bleeding   Right foot drop  Resolved Problems:   Syncope   Hospital Course: The patient had a prostate biopsy and had 2 episodes of syncope postprocedure.  The patient had a stroke work-up which was negative.  Patient also had right leg neurological symptoms which have resolved.  The patient was feeling well and wanted to go home.  The patient was discharged home in stable condition.  The patient still had some rectal bleeding but as per Dr. Erlene Quan that this is normal post prostate biopsy.  Assessment and Plan: * Syncope, vasovagal The patient had 2 episodes of syncope after prostate biopsy.  The patient is feeling well today and wants to go home.  Echocardiogram normal.  MRI of the brain negative for stroke.  MRA angio head and neck negative.  Orthostatic hypotension The patient's blood pressure did drop while standing but felt okay.  I did give a fluid bolus here.  We will hold lisinopril.  Check orthostatic vital signs and follow-up appointment.  Right foot drop Completely resolved.  Held off on further imaging.  Did well with physical therapy.  The patient did have a right buttock antibiotic injection yesterday.  Not sure if this briefly affected nerve in his right leg.  Either way, his right leg symptoms have resolved.  Rectal bleeding Secondary to prostate biopsy.  Case discussed with Dr. Erlene Quan and this is normal and this should settle down.  Follow-up as outpatient with Dr. Erlene Quan.  The patient also received an aspirin and a dose of Lovenox  for DVT prophylaxis which were both discontinued.  Elevated PSA Had prostate biopsy yesterday and results are pending.  CT scan of the pelvis showed perirectal and presacral lymphadenopathy concerning for metastases.  Await biopsy result and follow-up with urology as outpatient.         Consultants: None Procedures performed: None Disposition: Home Diet recommendation:  Cardiac diet  DISCHARGE MEDICATION: Allergies as of 06/15/2021   No Known Allergies      Medication List     STOP taking these medications    lisinopril 10 MG tablet Commonly known as: ZESTRIL        Follow-up Information     Albina Billet, MD Follow up in 5 day(s).   Specialty: Internal Medicine Why: Office is closed this afternoon patient to make own follow up appt. Contact information: [address]   [place] [address] [phone]         Hollice Espy, MD. Go on 06/23/2021.   Specialty: Urology Why: @ 11:15am Contact information: [place] [phone]                 Discharge Exam: Danley Danker Weights   06/14/21 2023  Weight: 92.3 kg   Physical Exam HENT:     Head: Normocephalic.     Mouth/Throat:     Pharynx: No oropharyngeal exudate.  Eyes:     General: Lids are normal.     Conjunctiva/sclera: Conjunctivae normal.  Cardiovascular:  Rate and Rhythm: Normal rate and regular rhythm.     Heart sounds: Normal heart sounds, S1 normal and S2 normal.  Pulmonary:     Breath sounds: No decreased breath sounds, wheezing, rhonchi or rales.  Abdominal:     Palpations: Abdomen is soft.     Tenderness: There is no abdominal tenderness.  Musculoskeletal:     Right lower leg: No swelling.     Left lower leg: No swelling.  Skin:    General: Skin is warm.     Findings: No rash.  Neurological:     Mental Status: He is alert and oriented to person, place, and time.     Comments: Power 5 out of 5 upper and lower  extremities.     Condition at discharge: Satisfactory  The results of significant diagnostics from this hospitalization (including imaging, microbiology, ancillary and laboratory) are listed below for reference.   Imaging Studies: CT PELVIS W CONTRAST  Result Date: 06/14/2021 CLINICAL DATA:  ?sepsis and R hip pain s/p prostate biopsy earlier today EXAM: CT PELVIS WITH CONTRAST TECHNIQUE: Multidetector CT imaging of the pelvis was performed using the standard protocol following the bolus administration of intravenous contrast. RADIATION DOSE REDUCTION: This exam was performed according to the departmental dose-optimization program which includes automated exposure control, adjustment of the mA and/or kV according to patient size and/or use of iterative reconstruction technique. CONTRAST:  166mL OMNIPAQUE IOHEXOL 300 MG/ML  SOLN COMPARISON:  None. FINDINGS: Urinary Tract:  No abnormality visualized. Bowel: The appendix is normal. Under distended rectum. Unremarkable visualized pelvic bowel loops. Vascular/Lymphatic: Severe calcified and noncalcified atherosclerotic plaque. Enlarged left common iliac artery measuring up to 1.6 cm. Left posterior enlarged perirectal lymph node measuring 1.2 cm. Multiple other perirectal lymph nodes noted along the presacral region (5: 57-67): As an example a 1.4 cm lymph node (2:26). No significant vascular abnormality seen. Reproductive: The prostate is unremarkable. Moderate to large volume right hydrocele. Small left hydrocele. Other: No intraperitoneal free fluid. No intraperitoneal free gas. No organized fluid collection. Musculoskeletal: Multilevel severe degenerative changes of the visualized lower lumbar spine. Mild retrolisthesis of L5 on S1. IMPRESSION: 1. Perirectal/presacral lymphadenopathy. 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 2. Under distended rectum with limited evaluation. 3. Moderate to large volume right hydrocele. 4. Small left hydrocele. 5. Aortic  Atherosclerosis (ICD10-I70.0). 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 Electronically Signed   By: Iven Finn M.D.   On: 06/14/2021 17:11   MR ANGIO HEAD WO CONTRAST  Result Date: 06/14/2021 CLINICAL DATA:  Seizure EXAM: MRI HEAD WITHOUT AND WITH CONTRAST MRA HEAD WITHOUT CONTRAST MRA NECK WITHOUT AND WITH CONTRAST TECHNIQUE: Multiplanar, multiecho pulse sequences of the brain and surrounding structures were obtained without and with intravenous contrast. Angiographic images of the Circle of Willis were obtained using MRA technique without intravenous contrast. Angiographic images of the neck were obtained using MRA technique without and with intravenous contrast. Carotid stenosis measurements (when applicable) are obtained utilizing NASCET criteria, using the distal internal carotid diameter as the denominator. CONTRAST:  33mL GADAVIST GADOBUTROL 1 MMOL/ML IV SOLN COMPARISON:  None. FINDINGS: MRI HEAD FINDINGS Brain: No acute infarct, mass effect or extra-axial collection. No acute or chronic hemorrhage. There is multifocal hyperintense T2-weighted signal within the white matter. Parenchymal volume and CSF spaces are normal. The midline structures are normal. Vascular: Major flow voids are preserved. Skull and upper cervical spine: Normal calvarium and skull base. Visualized upper cervical spine and soft tissues are normal. Sinuses/Orbits:No paranasal sinus fluid levels  or advanced mucosal thickening. No mastoid or middle ear effusion. Normal orbits. MRA HEAD FINDINGS POSTERIOR CIRCULATION: --Vertebral arteries: Normal --Inferior cerebellar arteries: Normal. --Basilar artery: Normal. --Superior cerebellar arteries: Normal. --Posterior cerebral arteries: Normal. ANTERIOR CIRCULATION: --Intracranial internal carotid arteries: Normal. --Anterior cerebral arteries (ACA): Normal. --Middle cerebral arteries (MCA): Normal. ANATOMIC VARIANTS: None MRA NECK FINDINGS Normal carotid and vertebral systems.  IMPRESSION: 1. No acute intracranial abnormality. 2. Normal MRA of the head and neck. 3. Mild chronic small vessel disease. Electronically Signed   By: Ulyses Jarred M.D.   On: 06/14/2021 19:30   MR ANGIO NECK W WO CONTRAST  Result Date: 06/14/2021 CLINICAL DATA:  Seizure EXAM: MRI HEAD WITHOUT AND WITH CONTRAST MRA HEAD WITHOUT CONTRAST MRA NECK WITHOUT AND WITH CONTRAST TECHNIQUE: Multiplanar, multiecho pulse sequences of the brain and surrounding structures were obtained without and with intravenous contrast. Angiographic images of the Circle of Willis were obtained using MRA technique without intravenous contrast. Angiographic images of the neck were obtained using MRA technique without and with intravenous contrast. Carotid stenosis measurements (when applicable) are obtained utilizing NASCET criteria, using the distal internal carotid diameter as the denominator. CONTRAST:  4mL GADAVIST GADOBUTROL 1 MMOL/ML IV SOLN COMPARISON:  None. FINDINGS: MRI HEAD FINDINGS Brain: No acute infarct, mass effect or extra-axial collection. No acute or chronic hemorrhage. There is multifocal hyperintense T2-weighted signal within the white matter. Parenchymal volume and CSF spaces are normal. The midline structures are normal. Vascular: Major flow voids are preserved. Skull and upper cervical spine: Normal calvarium and skull base. Visualized upper cervical spine and soft tissues are normal. Sinuses/Orbits:No paranasal sinus fluid levels or advanced mucosal thickening. No mastoid or middle ear effusion. Normal orbits. MRA HEAD FINDINGS POSTERIOR CIRCULATION: --Vertebral arteries: Normal --Inferior cerebellar arteries: Normal. --Basilar artery: Normal. --Superior cerebellar arteries: Normal. --Posterior cerebral arteries: Normal. ANTERIOR CIRCULATION: --Intracranial internal carotid arteries: Normal. --Anterior cerebral arteries (ACA): Normal. --Middle cerebral arteries (MCA): Normal. ANATOMIC VARIANTS: None MRA NECK  FINDINGS Normal carotid and vertebral systems. IMPRESSION: 1. No acute intracranial abnormality. 2. Normal MRA of the head and neck. 3. Mild chronic small vessel disease. Electronically Signed   By: Ulyses Jarred M.D.   On: 06/14/2021 19:30   MR BRAIN W WO CONTRAST  Result Date: 06/14/2021 CLINICAL DATA:  Seizure EXAM: MRI HEAD WITHOUT AND WITH CONTRAST MRA HEAD WITHOUT CONTRAST MRA NECK WITHOUT AND WITH CONTRAST TECHNIQUE: Multiplanar, multiecho pulse sequences of the brain and surrounding structures were obtained without and with intravenous contrast. Angiographic images of the Circle of Willis were obtained using MRA technique without intravenous contrast. Angiographic images of the neck were obtained using MRA technique without and with intravenous contrast. Carotid stenosis measurements (when applicable) are obtained utilizing NASCET criteria, using the distal internal carotid diameter as the denominator. CONTRAST:  48mL GADAVIST GADOBUTROL 1 MMOL/ML IV SOLN COMPARISON:  None. FINDINGS: MRI HEAD FINDINGS Brain: No acute infarct, mass effect or extra-axial collection. No acute or chronic hemorrhage. There is multifocal hyperintense T2-weighted signal within the white matter. Parenchymal volume and CSF spaces are normal. The midline structures are normal. Vascular: Major flow voids are preserved. Skull and upper cervical spine: Normal calvarium and skull base. Visualized upper cervical spine and soft tissues are normal. Sinuses/Orbits:No paranasal sinus fluid levels or advanced mucosal thickening. No mastoid or middle ear effusion. Normal orbits. MRA HEAD FINDINGS POSTERIOR CIRCULATION: --Vertebral arteries: Normal --Inferior cerebellar arteries: Normal. --Basilar artery: Normal. --Superior cerebellar arteries: Normal. --Posterior cerebral arteries: Normal. ANTERIOR CIRCULATION: --Intracranial internal carotid arteries:  Normal. --Anterior cerebral arteries (ACA): Normal. --Middle cerebral arteries (MCA):  Normal. ANATOMIC VARIANTS: None MRA NECK FINDINGS Normal carotid and vertebral systems. IMPRESSION: 1. No acute intracranial abnormality. 2. Normal MRA of the head and neck. 3. Mild chronic small vessel disease. Electronically Signed   By: Ulyses Jarred M.D.   On: 06/14/2021 19:30   DG Chest Port 1 View  Result Date: 06/14/2021 CLINICAL DATA:  Questionable sepsis.  Evaluate for abnormality. EXAM: PORTABLE CHEST 1 VIEW COMPARISON:  None. FINDINGS: Cardiac silhouette is at the upper limits of normal size. Mediastinal contours are within normal limits. The lungs are clear. No pleural effusion or pneumothorax. Mild multilevel degenerative disc changes of the thoracic spine. Moderate degenerative changes of the bilateral acromioclavicular joints. IMPRESSION: No active disease. Electronically Signed   By: Yvonne Kendall M.D.   On: 06/14/2021 17:23   ECHOCARDIOGRAM COMPLETE  Result Date: 06/15/2021    ECHOCARDIOGRAM REPORT   Patient Name:   SLATER MCMANAMAN Date of Exam: 06/15/2021 Medical Rec #:  [id number]          Height:       70.0 in Accession #:    [id number]         Weight:       203.5 lb Date of Birth:  01/10/1946           BSA:          2.103 m Patient Age:    76 years           BP:           115/75 mmHg Patient Gender: M                  HR:           109 bpm. Exam Location:  [facility] Procedure: 2D Echo, Cardiac Doppler and Color Doppler Indications:     Syncope R55  History:         Patient has no prior history of Echocardiogram examinations.                  Skin cancer.  Sonographer:     Sherrie Sport Referring Phys:  [id number] Loletha Grayer Diagnosing Phys: Serafina Royals MD IMPRESSIONS  1. Left ventricular ejection fraction, by estimation, is 60 to 65%. The left ventricle has normal function. The left ventricle has no regional wall motion abnormalities. Left ventricular diastolic parameters were normal.  2. Right ventricular systolic function is normal. The right ventricular size is normal.  3. The mitral  valve is normal in structure. Trivial mitral valve regurgitation.  4. The aortic valve is normal in structure. Aortic valve regurgitation is not visualized. FINDINGS  Left Ventricle: Left ventricular ejection fraction, by estimation, is 60 to 65%. The left ventricle has normal function. The left ventricle has no regional wall motion abnormalities. The left ventricular internal cavity size was normal in size. There is  no left ventricular hypertrophy. Left ventricular diastolic parameters were normal. Right Ventricle: The right ventricular size is normal. No increase in right ventricular wall thickness. Right ventricular systolic function is normal. Left Atrium: Left atrial size was normal in size. Right Atrium: Right atrial size was normal in size. Pericardium: There is no evidence of pericardial effusion. Mitral Valve: The mitral valve is normal in structure. Trivial mitral valve regurgitation. MV peak gradient, 5.1 mmHg. The mean mitral valve gradient is 2.0 mmHg. Tricuspid Valve: The tricuspid valve is normal in structure. Tricuspid valve regurgitation is trivial. Aortic  Valve: The aortic valve is normal in structure. Aortic valve regurgitation is not visualized. Aortic valve mean gradient measures 3.0 mmHg. Aortic valve peak gradient measures 5.9 mmHg. Aortic valve area, by VTI measures 4.16 cm. Pulmonic Valve: The pulmonic valve was normal in structure. Pulmonic valve regurgitation is not visualized. Aorta: The aortic root and ascending aorta are structurally normal, with no evidence of dilitation. IAS/Shunts: No atrial level shunt detected by color flow Doppler.  LEFT VENTRICLE PLAX 2D LVIDd:         4.00 cm   Diastology LVIDs:         3.00 cm   LV e' medial:    6.31 cm/s LV PW:         1.10 cm   LV E/e' medial:  10.6 LV IVS:        1.10 cm   LV e' lateral:   7.83 cm/s LVOT diam:     2.20 cm   LV E/e' lateral: 8.5 LV SV:         80 LV SV Index:   38 LVOT Area:     3.80 cm  RIGHT VENTRICLE RV Basal diam:   3.00 cm RV S prime:     11.90 cm/s TAPSE (M-mode): 3.0 cm LEFT ATRIUM           Index        RIGHT ATRIUM           Index LA diam:      3.70 cm 1.76 cm/m   RA Area:     18.00 cm LA Vol (A2C): 35.9 ml 17.07 ml/m  RA Volume:   56.90 ml  27.06 ml/m LA Vol (A4C): 38.8 ml 18.45 ml/m  AORTIC VALVE                    PULMONIC VALVE AV Area (Vmax):    3.36 cm     PV Vmax:        0.55 m/s AV Area (Vmean):   3.54 cm     PV Vmean:       38.800 cm/s AV Area (VTI):     4.16 cm     PV VTI:         0.079 m AV Vmax:           121.00 cm/s  PV Peak grad:   1.2 mmHg AV Vmean:          80.900 cm/s  PV Mean grad:   1.0 mmHg AV VTI:            0.192 m      RVOT Peak grad: 2 mmHg AV Peak Grad:      5.9 mmHg AV Mean Grad:      3.0 mmHg LVOT Vmax:         107.00 cm/s LVOT Vmean:        75.300 cm/s LVOT VTI:          0.210 m LVOT/AV VTI ratio: 1.09  AORTA Ao Root diam: 2.70 cm MITRAL VALVE                TRICUSPID VALVE MV Area (PHT): 3.50 cm     TR Peak grad:   9.7 mmHg MV Area VTI:   3.52 cm     TR Vmax:        156.00 cm/s MV Peak grad:  5.1 mmHg MV Mean grad:  2.0 mmHg     SHUNTS MV Vmax:  1.13 m/s     Systemic VTI:  0.21 m MV Vmean:      60.7 cm/s    Systemic Diam: 2.20 cm MV Decel Time: 217 msec     Pulmonic VTI:  0.124 m MV E velocity: 66.80 cm/s MV A velocity: 102.00 cm/s MV E/A ratio:  0.65 Serafina Royals MD Electronically signed by Serafina Royals MD Signature Date/Time: 06/15/2021/12:07:54 PM    Final    CT HEAD CODE STROKE WO CONTRAST  Result Date: 06/14/2021 CLINICAL DATA:  Code stroke.  Stroke, follow up EXAM: CT HEAD WITHOUT CONTRAST TECHNIQUE: Contiguous axial images were obtained from the base of the skull through the vertex without intravenous contrast. RADIATION DOSE REDUCTION: This exam was performed according to the departmental dose-optimization program which includes automated exposure control, adjustment of the mA and/or kV according to patient size and/or use of iterative reconstruction technique.  COMPARISON:  12/01/2004. FINDINGS: Brain: No evidence of acute large vascular territory infarction, hemorrhage, hydrocephalus, extra-axial collection or mass lesion/mass effect. Patchy white matter hypoattenuation, nonspecific but compatible with chronic microvascular ischemic disease. Vascular: No hyperdense vessel identified. Calcific intracranial atherosclerosis. Skull: No acute fracture. Sinuses/Orbits: Mild paranasal sinus mucosal thickening. Unremarkable orbits. Other: No mastoid effusions. ASPECTS [facility] Stroke Program Early CT Score) total score (0-10 with 10 being normal): 10. IMPRESSION: 1. No evidence of acute large vascular territory infarct or acute hemorrhage. ASPECTS is 10. 2. Chronic microvascular disease. Code stroke imaging results were communicated on 06/14/2021 at 3:32 pm to provider [facility] via secure text paging. Electronically Signed   By: Margaretha Sheffield M.D.   On: 06/14/2021 15:33    Microbiology: Results for orders placed or performed during the hospital encounter of 06/14/21  Resp Panel by RT-PCR (Flu A&B, Covid) Nasopharyngeal Swab     Status: None   Collection Time: 06/14/21  3:31 PM   Specimen: Nasopharyngeal Swab; Nasopharyngeal(NP) swabs in vial transport medium  Result Value Ref Range Status   SARS Coronavirus 2 by RT PCR NEGATIVE NEGATIVE Final    Comment: (NOTE) SARS-CoV-2 target nucleic acids are NOT DETECTED.  The SARS-CoV-2 RNA is generally detectable in upper respiratory specimens during the acute phase of infection. The lowest concentration of SARS-CoV-2 viral copies this assay can detect is 138 copies/mL. A negative result does not preclude SARS-Cov-2 infection and should not be used as the sole basis for treatment or other patient management decisions. A negative result may occur with  improper specimen collection/handling, submission of specimen other than nasopharyngeal swab, presence of viral mutation(s) within the areas targeted by this assay, and  inadequate number of viral copies(<138 copies/mL). A negative result must be combined with clinical observations, patient history, and epidemiological information. The expected result is Negative.  Fact Sheet for Patients:  EntrepreneurPulse.com.au  Fact Sheet for Healthcare Providers:  IncredibleEmployment.be  This test is no t yet approved or cleared by the [place] FDA and  has been authorized for detection and/or diagnosis of SARS-CoV-2 by FDA under an Emergency Use Authorization (EUA). This EUA will remain  in effect (meaning this test can be used) for the duration of the COVID-19 declaration under Section 564(b)(1) of the Act, 21 U.S.C.section 360bbb-3(b)(1), unless the authorization is terminated  or revoked sooner.       Influenza A by PCR NEGATIVE NEGATIVE Final   Influenza B by PCR NEGATIVE NEGATIVE Final    Comment: (NOTE) The Xpert Xpress SARS-CoV-2/FLU/RSV plus assay is intended as an aid in the diagnosis of influenza from Nasopharyngeal swab  specimens and should not be used as a sole basis for treatment. Nasal washings and aspirates are unacceptable for Xpert Xpress SARS-CoV-2/FLU/RSV testing.  Fact Sheet for Patients: EntrepreneurPulse.com.au  Fact Sheet for Healthcare Providers: IncredibleEmployment.be  This test is not yet approved or cleared by the [place] FDA and has been authorized for detection and/or diagnosis of SARS-CoV-2 by FDA under an Emergency Use Authorization (EUA). This EUA will remain in effect (meaning this test can be used) for the duration of the COVID-19 declaration under Section 564(b)(1) of the Act, 21 U.S.C. section 360bbb-3(b)(1), unless the authorization is terminated or revoked.  Performed at [facility], [address] [place], [place] [address]   Blood culture (routine x 2)     Status: None (Preliminary result)   Collection Time:  06/14/21  5:01 PM   Specimen: BLOOD  Result Value Ref Range Status   Specimen Description BLOOD LEFT ANTECUBITAL  Final   Special Requests   Final    BOTTLES DRAWN AEROBIC AND ANAEROBIC Blood Culture adequate volume   Culture   Final    NO GROWTH < 24 HOURS Performed at [facility], [address] [place], [place] [address]    Report Status PENDING  Incomplete  Blood culture (routine x 2)     Status: None (Preliminary result)   Collection Time: 06/14/21  5:14 PM   Specimen: BLOOD  Result Value Ref Range Status   Specimen Description BLOOD RIGHT ANTECUBITAL  Final   Special Requests   Final    BOTTLES DRAWN AEROBIC AND ANAEROBIC Blood Culture adequate volume   Culture   Final    NO GROWTH < 24 HOURS Performed at [facility], [address], [place], [place] [address]    Report Status PENDING  Incomplete    Labs: CBC: Recent Labs  Lab 06/14/21 1511 06/15/21 0509  WBC 12.3* 13.4*  NEUTROABS 6.4  --   HGB 16.5 15.0  HCT 47.0 42.6  MCV 93.3 91.2  PLT 239 093   Basic Metabolic Panel: Recent Labs  Lab 06/14/21 1511  NA 135  K 3.6  CL 104  CO2 22  GLUCOSE 130*  BUN 17  CREATININE 1.13  CALCIUM 9.1  MG 2.3   Liver Function Tests: Recent Labs  Lab 06/14/21 1511  AST 29  ALT 32  ALKPHOS 86  BILITOT 0.8  PROT 7.2  ALBUMIN 4.0   CBG: Recent Labs  Lab 06/14/21 1507 06/15/21 0611  GLUCAP 134* 123*    Discharge time spent: greater than 30 minutes.  Signed: Loletha Grayer, MD [facility] 06/15/2021

## 2021-06-15 NOTE — Progress Notes (Signed)
Pt will be discharging home. Family at bedside.

## 2021-06-15 NOTE — Clinical Social Work Note (Signed)
°  Transition of Care [facility]) Screening Note   Patient Details  Name: Howard Stafford Date of Birth: 25-Feb-1946   Transition of [facility]) CM/SW Contact:    Eileen Stanford, LCSW Phone HQIONG:[phone] 06/15/2021, 12:54 PM    Transition of Care Department [facility] has reviewed patient and no TOC needs have been identified at this time. We will continue to monitor patient advancement through interdisciplinary progression rounds. If new patient transition needs arise, please place a TOC consult.

## 2021-06-16 ENCOUNTER — Telehealth: Payer: Self-pay | Admitting: Urology

## 2021-06-16 DIAGNOSIS — C61 Malignant neoplasm of prostate: Secondary | ICD-10-CM

## 2021-06-16 LAB — SURGICAL PATHOLOGY

## 2021-06-16 NOTE — Telephone Encounter (Signed)
Could you please call this patient and check on him?    Also, please let him know that his prostate biopsy came back positive which is not unexpected.  I want to go ahead and try to get it PSMA PET scan prior to his follow-up next week if that is possible.  Please order and try to help expedite.  Hollice Espy, MD

## 2021-06-16 NOTE — Telephone Encounter (Signed)
Patient informed, symptoms have improved. Reassured bleeding from biopsy will improve-only mild bleeding. Aware can see blood for up to 6 weeks. Voiced understanding. Orders placed.

## 2021-06-19 DIAGNOSIS — I10 Essential (primary) hypertension: Secondary | ICD-10-CM | POA: Diagnosis not present

## 2021-06-19 LAB — CULTURE, BLOOD (ROUTINE X 2)
Culture: NO GROWTH
Culture: NO GROWTH
Special Requests: ADEQUATE
Special Requests: ADEQUATE

## 2021-06-21 ENCOUNTER — Ambulatory Visit: Payer: Medicare Other | Admitting: Urology

## 2021-06-23 ENCOUNTER — Ambulatory Visit: Payer: Medicare Other | Admitting: Urology

## 2021-06-28 ENCOUNTER — Encounter (HOSPITAL_COMMUNITY)
Admission: RE | Admit: 2021-06-28 | Discharge: 2021-06-28 | Disposition: A | Discharge status: Home / Self Care | Payer: Medicare Other | Source: Ambulatory Visit | Attending: Urology | Admitting: Urology

## 2021-06-28 ENCOUNTER — Other Ambulatory Visit: Payer: Self-pay

## 2021-06-28 DIAGNOSIS — C61 Malignant neoplasm of prostate: Secondary | ICD-10-CM | POA: Insufficient documentation

## 2021-06-28 DIAGNOSIS — R590 Localized enlarged lymph nodes: Secondary | ICD-10-CM | POA: Diagnosis not present

## 2021-06-28 DIAGNOSIS — C7951 Secondary malignant neoplasm of bone: Secondary | ICD-10-CM | POA: Diagnosis not present

## 2021-06-28 MED ORDER — PIFLIFOLASTAT F 18 (PYLARIFY) INJECTION
9.0000 | Freq: Once | INTRAVENOUS | Status: AC
  Administered 2021-06-28: 8.85 via INTRAVENOUS

## 2021-06-28 NOTE — Progress Notes (Addendum)
? ?06/29/21 ?9:23 AM  ? ?Howard Stafford ?Sep 19, 1945 ?[id number] ? ?Referring provider:  ?No referring provider defined for this encounter. ?Chief Complaint  ?Patient presents with  ? Results  ? ? ? ?HPI: ?Howard Stafford is a 76 y.o.male with a personal history of elevated PSA who presents today for prostate biopsy results and PET scan.  ? ?PSA on 06/02/2021 was 92.1. ? ?Rectal exam on 06/09/2021 showed grossly abnormal prostate, firm induration in the right mid gland as well as questionable nodules on the left. ? ?He underwent a prostate biopsy on 06/14/2021. Prostate biopsy was complicated by vasovagal episode he was admitted into the hospital for neurological and cardiovascular work-up which was all negative.  ? ?Surgical pathology was consistent with Gleason 4+3 involving three cores affecting up to 91%, Gleason 3+4 involving 6 cores affecting up to 100%, Gleason 3+3 involving 3 cores affecting up to 25%.  ? ?PSMA PET scan was personally reviewed today and my interpretation visualized enhancement in prostate and parirectal node, adenopathy in retroperitoneum, common iliacs, pre sacral, external iliac, and perirectal, and anterior to the aorta just proximal to the bifurcation. ? ?He is accompanied by his wife.He reports that  prescriptions are not covered on his insurance and his is worried about the cost of treatment.  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 ? ?He also mentions today that he is concerned about some left calf pain that he has.  The pain only happens when he is ambulating rigorously and improves within about 10 minutes of rest.  Does not notice any calf tenderness or swelling otherwise. ? ?Surgical pathology: ?[A] PROSTATE, LEFT BASE:   ACINAR ADENOCARCINOMA, GLEASON 3+4=7  (GG  ?2), INVOLVING 2 OF 2 CORES, MEASURING 5  MM ( 56%). 30% PATTERN 4  ? [B] PROSTATE, LEFT MID:   ACINAR ADENOCARCINOMA, GLEASON 3+4=7  (GG 2),  ?INVOLVING 1 OF 1 CORES, MEASURING 11  MM ( 100%). 30% PATTERN 4;   ?CRIBRIFORM AND GLOMERULOID PATTERN 4 PRESENT  ? [C] PROSTATE, LEFT APEX:   ACINAR ADENOCARCINOMA, GLEASON 4+3=7  (GG  ?3), INVOLVING 2 OF 2 CORES, MEASURING 10  MM ( 91%). 60% PATTERN 4;  ?CRIBRIFORM PATTERN 4 PRESENT; PERINEURAL INVASION PRESENT  ? [D] PROSTATE, RIGHT BASE:   ACINAR ADENOCARCINOMA, GLEASON 3+4=7  (GG  ?2), INVOLVING 1 OF 2 CORES, MEASURING 9  MM ( 69%). 20% PATTERN 4  ? [E] PROSTATE, RIGHT MID:   ACINAR ADENOCARCINOMA, GLEASON 3+4=7  (GG  ?2), INVOLVING 1 OF 1 CORES, MEASURING 7  MM ( 78%). 30% PATTERN 4;  ?CRIBRIFORM PATTERN 4 PRESENT  ? [F] PROSTATE, RIGHT APEX:   ACINAR ADENOCARCINOMA, GLEASON 3+3=6  (GG  ?1), INVOLVING 1 OF 3 CORES, MEASURING 1  MM ( 11%).  ? [G] PROSTATE, LEFT LATERAL BASE:   ACINAR ADENOCARCINOMA, GLEASON 4+3=7  ?(GG 3), INVOLVING 3 OF 5 CORES, MEASURING 10  MM ( 77%). 70% PATTERN 4;  ?CRIBRIFORM PATTERN 4 PRESENT  ? [H] PROSTATE, LEFT LATERAL MID:   ACINAR ADENOCARCINOMA, GLEASON 4+3=7  ?(GG 3), INVOLVING 2 OF 3 CORES, MEASURING 7  MM ( 70%). 70% PATTERN 4;  ?CRIBRIFORM PATTERN 4 PRESENT  ? [I] PROSTATE, LEFT LATERAL APEX:   ACINAR ADENOCARCINOMA, GLEASON 3+3=6  ?(GG 1), INVOLVING 1 OF 1 CORES, MEASURING 1  MM ( 13%).  ? [J] PROSTATE, RIGHT LATERAL BASE:   ACINAR ADENOCARCINOMA, GLEASON  ?3+4=7  (GG 2), INVOLVING 2 OF 2 CORES, MEASURING 6  MM ( 67%).  20%  ?PATTERN 4; CRIBRIFORM PATTERN 4 PRESENT  ? [K] PROSTATE, RIGHT LATERAL MID:   ACINAR ADENOCARCINOMA, GLEASON 3+4=7  ?(GG 2), INVOLVING 1 OF 2 CORES, MEASURING 6  MM ( 67%). 10% PATTERN 4  ?PRESENT  ? [L] PROSTATE, RIGHT LATERAL APEX:   ACINAR ADENOCARCINOMA, GLEASON  ?3+3=6  (GG 1), INVOLVING 1 OF 2 CORES, MEASURING 1  MM ( 25%).  ? ? ? ? ? ?PMH: ?Past Medical History:  ?Diagnosis Date  ? Skin cancer face  ? ? ?Surgical History: ?Past Surgical History:  ?Procedure Laterality Date  ? BACK SURGERY    ? EYE SURGERY    ? ? ?Home Medications:  ?Allergies as of 06/29/2021   ?No Known Allergies ?  ? ?  ?Medication List  ?  ? ?  ?  Accurate as of June 29, 2021 11:59 PM. If you have any questions, ask your nurse or doctor.  ?  ?  ? ?  ? ?lisinopril 5 MG tablet ?Commonly known as: ZESTRIL ?Take 5 mg by mouth daily. ?  ?Nubeqa 300 MG tablet ?Generic drug: darolutamide ?Take 2 tablets (600 mg total) by mouth 2 (two) times daily with a meal. ?  ? ?  ? ? ?Allergies: No Known Allergies ? ?Family History: ?No family history on file. ? ?Social History:  reports that he has been smoking cigarettes. He has been smoking an average of 1 pack per day. He has been exposed to tobacco smoke. He has never used smokeless tobacco. He reports current alcohol use. He reports that he does not use drugs. ? ? ?Physical Exam: ?BP (!) 164/77   Pulse 70   Ht 5\' 10"  (1.778 m)   Wt 200 lb (90.7 kg)   BMI 28.70 kg/m?   ?Constitutional:  Alert and oriented, No acute distress.  Accompanied by his wife today. ?HEENT: [place] AT, moist mucus membranes.  Trachea midline, no masses. ?Cardiovascular: No clubbing, cyanosis, or edema. ?Respiratory: Normal respiratory effort, no increased work of breathing. ?Skin: No rashes, bruises or suspicious lesions. ?Neurologic: Grossly intact, no focal deficits, moving all 4 extremities. ?Psychiatric: Normal mood and affect. ? ?Laboratory Data: ?Lab Results  ?Component Value Date  ? CREATININE 1.13 06/14/2021  ? ?Lab Results  ?Component Value Date  ? HGBA1C 5.3 06/14/2021  ? ? ?Pertinent Imaging: ?PSMA PET scan personally reviewed and interpreted today.   ? ?Assessment & Plan:   ?Prostate cancer, metastatic castrate sensitive ?- High volume high risk unfavorable  ?- The patient was counseled about the natural history of prostate cancer and the standard treatment options that are available for prostate cancer. It was explained to him how his age and life expectancy, clinical stage, Gleason score, and PSA affect his prognosis, the decision to proceed with additional staging studies, as well as how that information influences recommended treatment  strategies. We discussed the roles for active surveillance, radiation therapy, surgical therapy, androgen deprivation, as well as ablative therapy options for the treatment of prostate cancer as appropriate to his individual cancer situation.  ?- PSMA PET scan was reviewed personally today likley metastatic catrate sensitive diease ?-  Due to involvement outside the true pelvis, unclear whether he would be candidate for radiation, discussed with Dr. Baruch Gouty who will eval further and make recommendation ?- Will start firmagon and transition to Lupron.  ?-Will start him on Nebeqa (and consider plus minus docetaxel based on medical oncology input based on data from ARASENS trial); we will need financial assistance for this based  on income and lack of pharmacy benefit ?-Will likely need to engage cancer center to help with social/financial issues ?- Dicussed the side effects of ADT, including hot flashes, weight gain, loss of muscle mass, lose of bone density, etc. Long term effects, including cardiovascular disease also discussed.  ?- Referral sent to medical oncology /radiation oncology ? ?2. Right lowe extremity pian  ?- Improved with rest  ?- Highly concerning for peripheral vascular disease. ?- Referred to vascular surgery  ? ? ?[name] as a scribe for Hollice Espy, MD.,have documented all relevant documentation on the behalf of Hollice Espy, MD,as directed by  Hollice Espy, MD while in the presence of Hollice Espy, MD. ? ?I have reviewed the above documentation for accuracy and completeness, and I agree with the above.  ? ?Hollice Espy, MD ? ?[facility] ?[address], [address] ?[place], [place] [address] ?[phone] ? ? ?I spent 60 total minutes on the day of the encounter including pre-visit review of the medical record, face-to-face time with the patient, and post visit ordering of labs/imaging/tests.  I spent time discussing case with Dr. Baruch Gouty,  reaching out to radiology, and greater than 50% the time was face-to-face with the patient and his wife.  They are very anxious and of lots of questions. ? ?

## 2021-06-29 ENCOUNTER — Ambulatory Visit (INDEPENDENT_AMBULATORY_CARE_PROVIDER_SITE_OTHER): Payer: Medicare Other | Admitting: Urology

## 2021-06-29 VITALS — BP 164/77 | HR 70 | Ht 70.0 in | Wt 200.0 lb

## 2021-06-29 DIAGNOSIS — M79604 Pain in right leg: Secondary | ICD-10-CM | POA: Diagnosis not present

## 2021-06-29 DIAGNOSIS — C61 Malignant neoplasm of prostate: Secondary | ICD-10-CM | POA: Diagnosis not present

## 2021-06-29 MED ORDER — NUBEQA 300 MG PO TABS: 600.0000 mg | ORAL_TABLET | Freq: Two times a day (BID) | ORAL | 6 refills | Status: DC

## 2021-07-03 ENCOUNTER — Telehealth: Payer: Self-pay | Admitting: *Deleted

## 2021-07-03 NOTE — Telephone Encounter (Addendum)
Patient informed, voice understanding.  ? ? ?----- Message from Hollice Espy, MD sent at 06/30/2021  9:48 AM EST ----- ?Please let this patient and/or his wife know that the final read from the PET scan came back and there is a spot along the spine which may or may not represent prostate cancer.  I have ordered an MRI to further evaluate this. ? ?Additionally, I did reach out to the radiation oncologist Dr. Baruch Gouty who does feel that depending on the MRI result, radiation may be be beneficial or possible.  I also placed a radiation oncology referral. ? ?Hollice Espy, MD ? ?

## 2021-07-05 ENCOUNTER — Telehealth: Payer: Self-pay

## 2021-07-05 ENCOUNTER — Encounter: Payer: Self-pay | Admitting: *Deleted

## 2021-07-05 NOTE — Telephone Encounter (Signed)
Firmagon PA initiated via [facility] portal, awaiting response.  ?

## 2021-07-05 NOTE — Telephone Encounter (Signed)
-----   Message from Hollice Espy, MD sent at 06/30/2021  9:46 AM EST ----- ?This patient will need prior authorization for degerelix x 1 loading dose then transition to Eligard thereafter.  We also started the process of trying to get him Nubeqa and will most certainly need to be enrolled in a pap program.   ?

## 2021-07-07 DIAGNOSIS — I10 Essential (primary) hypertension: Secondary | ICD-10-CM | POA: Diagnosis not present

## 2021-07-07 DIAGNOSIS — C61 Malignant neoplasm of prostate: Secondary | ICD-10-CM | POA: Insufficient documentation

## 2021-07-07 DIAGNOSIS — D075 Carcinoma in situ of prostate: Secondary | ICD-10-CM | POA: Diagnosis not present

## 2021-07-07 NOTE — Telephone Encounter (Signed)
Incoming benefits investigation, no PA required for [employer]. Called pt made him aware. Pt scheduled for 07/12/21 at 11:15am.  ?

## 2021-07-07 NOTE — Progress Notes (Unsigned)
[place]  [phone]) [phone] [phone]) [phone]  ID: Elpidio Eric OB: 14-Apr-1946  MR#: [id number]  IZT#:[id number]  Patient Care Team: Albina Billet, MD as PCP - General (Internal Medicine) Hollice Espy, MD as Consulting Physician (Urology) Noreene Filbert, MD as Consulting Physician (Radiation Oncology)  CHIEF COMPLAINT: Prostate cancer.  INTERVAL HISTORY: ***  REVIEW OF SYSTEMS:   ROS  As per HPI. Otherwise, a complete review of systems is negative.  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  ADVANCED DIRECTIVES (Y/N):  N  HEALTH MAINTENANCE: Social History   Tobacco Use   Smoking status: Every Day    Packs/day: 1.00    Years: 50.00    Pack years: 50.00    Types: Cigarettes    Passive exposure: Current   Smokeless tobacco: Never  Substance Use Topics   Alcohol use: Yes    Comment: Everynight [name]   Drug use: No     Colonoscopy:  PAP:  Bone density:  Lipid panel:  No Known Allergies  Current Outpatient Medications  Medication Sig Dispense Refill   darolutamide (NUBEQA) 300 MG tablet Take 2 tablets (600 mg total) by mouth 2 (two) times daily with a meal. 120 tablet 6   lisinopril (ZESTRIL) 5 MG tablet Take 5 mg by mouth daily.     No current facility-administered medications for this visit.    OBJECTIVE: There were no vitals filed for this visit.   There is no height or weight on file to calculate BMI.    ECOG FS:{CHL ONC [id number]  General: Well-developed,  well-nourished, no acute distress. Eyes: Pink conjunctiva, anicteric sclera. HEENT: Normocephalic, moist mucous membranes. Lungs: No audible wheezing or coughing. Heart: Regular rate and rhythm. Abdomen: Soft, nontender, no obvious distention. Musculoskeletal: No edema, cyanosis, or clubbing. Neuro: Alert, answering all questions appropriately. Cranial nerves grossly intact. Skin: No rashes or petechiae noted. Psych: Normal affect. Lymphatics: No cervical, calvicular, axillary or inguinal LAD.   LAB RESULTS:  Lab Results  Component Value Date   NA 135 06/14/2021   K 3.6 06/14/2021   CL 104 06/14/2021   CO2 22 06/14/2021   GLUCOSE 130 (H) 06/14/2021   BUN 17 06/14/2021   CREATININE 1.13 06/14/2021   CALCIUM 9.1 06/14/2021   PROT 7.2 06/14/2021   ALBUMIN 4.0 06/14/2021   AST 29 06/14/2021   ALT 32 06/14/2021   ALKPHOS 86 06/14/2021   BILITOT 0.8 06/14/2021   GFRNONAA >60 06/14/2021    Lab Results  Component Value Date   WBC 13.4 (H) 06/15/2021   NEUTROABS 6.4 06/14/2021   HGB 15.0 06/15/2021   HCT 42.6 06/15/2021   MCV 91.2 06/15/2021   PLT 171 06/15/2021     STUDIES: CT PELVIS W CONTRAST  Result Date: 06/14/2021 CLINICAL DATA:  ?sepsis and R hip pain s/p prostate biopsy earlier today EXAM: CT PELVIS WITH CONTRAST TECHNIQUE: Multidetector CT imaging of the pelvis was performed using the standard protocol  following the bolus administration of intravenous contrast. RADIATION DOSE REDUCTION: This exam was performed according to the departmental dose-optimization program which includes automated exposure control, adjustment of the mA and/or kV according to patient size and/or use of iterative reconstruction technique. CONTRAST:  143m OMNIPAQUE IOHEXOL 300 MG/ML  SOLN COMPARISON:  None. FINDINGS: Urinary Tract:  No abnormality visualized. Bowel: The appendix is normal. Under distended rectum. Unremarkable visualized pelvic bowel loops. Vascular/Lymphatic: Severe calcified  and noncalcified atherosclerotic plaque. Enlarged left common iliac artery measuring up to 1.6 cm. Left posterior enlarged perirectal lymph node measuring 1.2 cm. Multiple other perirectal lymph nodes noted along the presacral region (5: 57-67): As an example a 1.4 cm lymph node (2:26). No significant vascular abnormality seen. Reproductive: The prostate is unremarkable. Moderate to large volume right hydrocele. Small left hydrocele. Other: No intraperitoneal free fluid. No intraperitoneal free gas. No organized fluid collection. Musculoskeletal: Multilevel severe degenerative changes of the visualized lower lumbar spine. Mild retrolisthesis of L5 on S1. IMPRESSION: 1. Perirectal/presacral lymphadenopathy. 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 2. Under distended rectum with limited evaluation. 3. Moderate to large volume right hydrocele. 4. Small left hydrocele. 5. Aortic Atherosclerosis (ICD10-I70.0). 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 Electronically Signed   By: MIven FinnM.D.   On: 06/14/2021 17:11   MR ANGIO HEAD WO CONTRAST  Result Date: 06/14/2021 CLINICAL DATA:  Seizure EXAM: MRI HEAD WITHOUT AND WITH CONTRAST MRA HEAD WITHOUT CONTRAST MRA NECK WITHOUT AND WITH CONTRAST TECHNIQUE: Multiplanar, multiecho pulse sequences of the brain and surrounding structures were obtained without and with intravenous contrast. Angiographic images of the Circle of Willis were obtained using MRA technique without intravenous contrast. Angiographic images of the neck were obtained using MRA technique without and with intravenous contrast. Carotid stenosis measurements (when applicable) are obtained utilizing NASCET criteria, using the distal internal carotid diameter as the denominator. CONTRAST:  967mGADAVIST GADOBUTROL 1 MMOL/ML IV SOLN COMPARISON:  None. FINDINGS: MRI HEAD FINDINGS Brain: No acute infarct, mass effect or extra-axial collection. No acute or chronic hemorrhage. There is multifocal hyperintense  T2-weighted signal within the white matter. Parenchymal volume and CSF spaces are normal. The midline structures are normal. Vascular: Major flow voids are preserved. Skull and upper cervical spine: Normal calvarium and skull base. Visualized upper cervical spine and soft tissues are normal. Sinuses/Orbits:No paranasal sinus fluid levels or advanced mucosal thickening. No mastoid or middle ear effusion. Normal orbits. MRA HEAD FINDINGS POSTERIOR CIRCULATION: --Vertebral arteries: Normal --Inferior cerebellar arteries: Normal. --Basilar artery: Normal. --Superior cerebellar arteries: Normal. --Posterior cerebral arteries: Normal. ANTERIOR CIRCULATION: --Intracranial internal carotid arteries: Normal. --Anterior cerebral arteries (ACA): Normal. --Middle cerebral arteries (MCA): Normal. ANATOMIC VARIANTS: None MRA NECK FINDINGS Normal carotid and vertebral systems. IMPRESSION: 1. No acute intracranial abnormality. 2. Normal MRA of the head and neck. 3. Mild chronic small vessel disease. Electronically Signed   By: KeUlyses Jarred.D.   On: 06/14/2021 19:30   MR ANGIO NECK W WO CONTRAST  Result Date: 06/14/2021 CLINICAL DATA:  Seizure EXAM: MRI HEAD WITHOUT AND WITH CONTRAST MRA HEAD WITHOUT CONTRAST MRA NECK WITHOUT AND WITH CONTRAST TECHNIQUE: Multiplanar, multiecho pulse sequences of the brain and surrounding structures were obtained without and with intravenous contrast. Angiographic images of the Circle of Willis were obtained using MRA technique without intravenous contrast. Angiographic images of the neck were obtained using MRA technique without and with intravenous contrast. Carotid stenosis measurements (when applicable) are obtained utilizing NASCET criteria, using the distal internal carotid diameter as the denominator.  CONTRAST:  34m GADAVIST GADOBUTROL 1 MMOL/ML IV SOLN COMPARISON:  None. FINDINGS: MRI HEAD FINDINGS Brain: No acute infarct, mass effect or extra-axial collection. No acute or chronic  hemorrhage. There is multifocal hyperintense T2-weighted signal within the white matter. Parenchymal volume and CSF spaces are normal. The midline structures are normal. Vascular: Major flow voids are preserved. Skull and upper cervical spine: Normal calvarium and skull base. Visualized upper cervical spine and soft tissues are normal. Sinuses/Orbits:No paranasal sinus fluid levels or advanced mucosal thickening. No mastoid or middle ear effusion. Normal orbits. MRA HEAD FINDINGS POSTERIOR CIRCULATION: --Vertebral arteries: Normal --Inferior cerebellar arteries: Normal. --Basilar artery: Normal. --Superior cerebellar arteries: Normal. --Posterior cerebral arteries: Normal. ANTERIOR CIRCULATION: --Intracranial internal carotid arteries: Normal. --Anterior cerebral arteries (ACA): Normal. --Middle cerebral arteries (MCA): Normal. ANATOMIC VARIANTS: None MRA NECK FINDINGS Normal carotid and vertebral systems. IMPRESSION: 1. No acute intracranial abnormality. 2. Normal MRA of the head and neck. 3. Mild chronic small vessel disease. Electronically Signed   By: KUlyses JarredM.D.   On: 06/14/2021 19:30   MR BRAIN W WO CONTRAST  Result Date: 06/14/2021 CLINICAL DATA:  Seizure EXAM: MRI HEAD WITHOUT AND WITH CONTRAST MRA HEAD WITHOUT CONTRAST MRA NECK WITHOUT AND WITH CONTRAST TECHNIQUE: Multiplanar, multiecho pulse sequences of the brain and surrounding structures were obtained without and with intravenous contrast. Angiographic images of the Circle of Willis were obtained using MRA technique without intravenous contrast. Angiographic images of the neck were obtained using MRA technique without and with intravenous contrast. Carotid stenosis measurements (when applicable) are obtained utilizing NASCET criteria, using the distal internal carotid diameter as the denominator. CONTRAST:  967mGADAVIST GADOBUTROL 1 MMOL/ML IV SOLN COMPARISON:  None. FINDINGS: MRI HEAD FINDINGS Brain: No acute infarct, mass effect or  extra-axial collection. No acute or chronic hemorrhage. There is multifocal hyperintense T2-weighted signal within the white matter. Parenchymal volume and CSF spaces are normal. The midline structures are normal. Vascular: Major flow voids are preserved. Skull and upper cervical spine: Normal calvarium and skull base. Visualized upper cervical spine and soft tissues are normal. Sinuses/Orbits:No paranasal sinus fluid levels or advanced mucosal thickening. No mastoid or middle ear effusion. Normal orbits. MRA HEAD FINDINGS POSTERIOR CIRCULATION: --Vertebral arteries: Normal --Inferior cerebellar arteries: Normal. --Basilar artery: Normal. --Superior cerebellar arteries: Normal. --Posterior cerebral arteries: Normal. ANTERIOR CIRCULATION: --Intracranial internal carotid arteries: Normal. --Anterior cerebral arteries (ACA): Normal. --Middle cerebral arteries (MCA): Normal. ANATOMIC VARIANTS: None MRA NECK FINDINGS Normal carotid and vertebral systems. IMPRESSION: 1. No acute intracranial abnormality. 2. Normal MRA of the head and neck. 3. Mild chronic small vessel disease. Electronically Signed   By: KeUlyses Jarred.D.   On: 06/14/2021 19:30   DG Chest Port 1 View  Result Date: 06/14/2021 CLINICAL DATA:  Questionable sepsis.  Evaluate for abnormality. EXAM: PORTABLE CHEST 1 VIEW COMPARISON:  None. FINDINGS: Cardiac silhouette is at the upper limits of normal size. Mediastinal contours are within normal limits. The lungs are clear. No pleural effusion or pneumothorax. Mild multilevel degenerative disc changes of the thoracic spine. Moderate degenerative changes of the bilateral acromioclavicular joints. IMPRESSION: No active disease. Electronically Signed   By: RoYvonne Kendall.D.   On: 06/14/2021 17:23   ECHOCARDIOGRAM COMPLETE  Result Date: 06/15/2021    ECHOCARDIOGRAM REPORT   Patient Name:   Howard PREZIOSIate of Exam: 06/15/2021 Medical Rec #:  [id number]        Height:       70.0 in Accession #:  [id number]         Weight:       203.5 lb Date of Birth:  [birth year]           BSA:          2.103 m Patient Age:    76 years           BP:           115/75 mmHg Patient Gender: M                  HR:           109 bpm. Exam Location:  [facility] Procedure: 2D Echo, Cardiac Doppler and Color Doppler Indications:     Syncope R55  History:         Patient has no prior history of Echocardiogram examinations.                  Skin cancer.  Sonographer:     Sherrie Sport Referring Phys:  [id number] Loletha Grayer Diagnosing Phys: Serafina Royals MD IMPRESSIONS  1. Left ventricular ejection fraction, by estimation, is 60 to 65%. The left ventricle has normal function. The left ventricle has no regional wall motion abnormalities. Left ventricular diastolic parameters were normal.  2. Right ventricular systolic function is normal. The right ventricular size is normal.  3. The mitral valve is normal in structure. Trivial mitral valve regurgitation.  4. The aortic valve is normal in structure. Aortic valve regurgitation is not visualized. FINDINGS  Left Ventricle: Left ventricular ejection fraction, by estimation, is 60 to 65%. The left ventricle has normal function. The left ventricle has no regional wall motion abnormalities. The left ventricular internal cavity size was normal in size. There is  no left ventricular hypertrophy. Left ventricular diastolic parameters were normal. Right Ventricle: The right ventricular size is normal. No increase in right ventricular wall thickness. Right ventricular systolic function is normal. Left Atrium: Left atrial size was normal in size. Right Atrium: Right atrial size was normal in size. Pericardium: There is no evidence of pericardial effusion. Mitral Valve: The mitral valve is normal in structure. Trivial mitral valve regurgitation. MV peak gradient, 5.1 mmHg. The mean mitral valve gradient is 2.0 mmHg. Tricuspid Valve: The tricuspid valve is normal in structure. Tricuspid valve regurgitation is  trivial. Aortic Valve: The aortic valve is normal in structure. Aortic valve regurgitation is not visualized. Aortic valve mean gradient measures 3.0 mmHg. Aortic valve peak gradient measures 5.9 mmHg. Aortic valve area, by VTI measures 4.16 cm. Pulmonic Valve: The pulmonic valve was normal in structure. Pulmonic valve regurgitation is not visualized. Aorta: The aortic root and ascending aorta are structurally normal, with no evidence of dilitation. IAS/Shunts: No atrial level shunt detected by color flow Doppler.  LEFT VENTRICLE PLAX 2D LVIDd:         4.00 cm   Diastology LVIDs:         3.00 cm   LV e' medial:    6.31 cm/s LV PW:         1.10 cm   LV E/e' medial:  10.6 LV IVS:        1.10 cm   LV e' lateral:   7.83 cm/s LVOT diam:     2.20 cm   LV E/e' lateral: 8.5 LV SV:         80 LV SV Index:   38 LVOT Area:     3.80 cm  RIGHT VENTRICLE RV Basal diam:  3.00 cm RV S prime:     11.90 cm/s TAPSE (M-mode): 3.0 cm LEFT ATRIUM           Index        RIGHT ATRIUM           Index LA diam:      3.70 cm 1.76 cm/m   RA Area:     18.00 cm LA Vol (A2C): 35.9 ml 17.07 ml/m  RA Volume:   56.90 ml  27.06 ml/m LA Vol (A4C): 38.8 ml 18.45 ml/m  AORTIC VALVE                    PULMONIC VALVE AV Area (Vmax):    3.36 cm     PV Vmax:        0.55 m/s AV Area (Vmean):   3.54 cm     PV Vmean:       38.800 cm/s AV Area (VTI):     4.16 cm     PV VTI:         0.079 m AV Vmax:           121.00 cm/s  PV Peak grad:   1.2 mmHg AV Vmean:          80.900 cm/s  PV Mean grad:   1.0 mmHg AV VTI:            0.192 m      RVOT Peak grad: 2 mmHg AV Peak Grad:      5.9 mmHg AV Mean Grad:      3.0 mmHg LVOT Vmax:         107.00 cm/s LVOT Vmean:        75.300 cm/s LVOT VTI:          0.210 m LVOT/AV VTI ratio: 1.09  AORTA Ao Root diam: 2.70 cm MITRAL VALVE                TRICUSPID VALVE MV Area (PHT): 3.50 cm     TR Peak grad:   9.7 mmHg MV Area VTI:   3.52 cm     TR Vmax:        156.00 cm/s MV Peak grad:  5.1 mmHg MV Mean grad:  2.0 mmHg      SHUNTS MV Vmax:       1.13 m/s     Systemic VTI:  0.21 m MV Vmean:      60.7 cm/s    Systemic Diam: 2.20 cm MV Decel Time: 217 msec     Pulmonic VTI:  0.124 m MV E velocity: 66.80 cm/s MV A velocity: 102.00 cm/s MV E/A ratio:  0.65 Serafina Royals MD Electronically signed by Serafina Royals MD Signature Date/Time: 06/15/2021/12:07:54 PM    Final    CT HEAD CODE STROKE WO CONTRAST  Result Date: 06/14/2021 CLINICAL DATA:  Code stroke.  Stroke, follow up EXAM: CT HEAD WITHOUT CONTRAST TECHNIQUE: Contiguous axial images were obtained from the base of the skull through the vertex without intravenous contrast. RADIATION DOSE REDUCTION: This exam was performed according to the departmental dose-optimization program which includes automated exposure control, adjustment of the mA and/or kV according to patient size and/or use of iterative reconstruction technique. COMPARISON:  12/01/2004. FINDINGS: Brain: No evidence of acute large vascular territory infarction, hemorrhage, hydrocephalus, extra-axial collection or mass lesion/mass effect. Patchy white matter hypoattenuation, nonspecific but compatible with chronic microvascular ischemic disease. Vascular: No hyperdense vessel identified. Calcific intracranial atherosclerosis. Skull: No acute  fracture. Sinuses/Orbits: Mild paranasal sinus mucosal thickening. Unremarkable orbits. Other: No mastoid effusions. ASPECTS [facility] Stroke Program Early CT Score) total score (0-10 with 10 being normal): 10. IMPRESSION: 1. No evidence of acute large vascular territory infarct or acute hemorrhage. ASPECTS is 10. 2. Chronic microvascular disease. Code stroke imaging results were communicated on 06/14/2021 at 3:32 pm to provider [facility] via secure text paging. Electronically Signed   By: Margaretha Sheffield M.D.   On: 06/14/2021 15:33   NM PET (PSMA) SKULL TO MID THIGH  Result Date: 06/29/2021 CLINICAL DATA:  Prostate carcinoma with biochemical recurrence. EXAM: NUCLEAR MEDICINE PET  SKULL BASE TO THIGH TECHNIQUE: 8.9 mCi F18 Piflufolastat (Pylarify) was injected intravenously. Full-ring PET imaging was performed from the skull base to thigh after the radiotracer. CT data was obtained and used for attenuation correction and anatomic localization. COMPARISON:  Pelvic CT 06/14/2021 FINDINGS: NECK No radiotracer activity in neck lymph nodes. Incidental CT finding: None CHEST No radiotracer accumulation within mediastinal or hilar lymph nodes. No suspicious pulmonary nodules on the CT scan. Incidental CT finding: None ABDOMEN/PELVIS Prostate: Intense radiotracer activity within the prostate gland. Broad lesion in the LEFT lobe gland SUV max equal 30.2 measures 2-3 cm. Smaller lesion in midline rest RIGHT base with radiotracer intensity. Lymph nodes: Intense radiotracer activity associated with several pelvic lymph nodes. Node in the LEFT mesorectum measuring 13 mm with SUV max equal 40.3 (image 197) Cluster of intense radiotracer avid nodes in the presacral fat/mesorectum SUV max equal 50.9 (image 188) these nodes measure up to 12 mm in a cluster of 8 nodes. More superiorly, periaortic lymph node ventral to the aorta just above the bifurcation measures 8 mm (image 171) with SUV max equal 43.3. This is the most superior lymph node in the abdomen pelvis. Liver: No evidence of liver metastasis Incidental CT finding: None SKELETON Focal activity in the T7 vertebral body is intense with SUV max equal 8.1. There is a corresponding lesion in the CT portion exam with a thin sclerotic rim (image 96/series 4) which is associated with the endplate on sagittal projection. IMPRESSION: 1. Intense radiotracer activity within LEFT RIGHT lobe prostate gland consistent with high-grade prostate adenocarcinoma. 2. Multiple enlarged intensely radiotracer avid lymph nodes primarily in medial rectum of the pelvis. 3. Single retroperitoneal periaortic metastatic node just above the bifurcation. This is the most superior  metastatic node in the abdomen pelvis. 4. No evidence of visceral metastasis. 5. Solitary intensely radiotracer avid bone lesion in the T7 vertebral body. Differential would include benign activity associated with a Schmorl's node versus solitary skeletal metastasis. CT findings favor Schmorl's nodes (MRI thoracic spine without with contrast may add specificity). Electronically Signed   By: Suzy Bouchard M.D.   On: 06/29/2021 12:12    ASSESSMENT: Prostate cancer.  PLAN:    Prostate cancer:  Patient expressed understanding and was in agreement with this plan. He also understands that He can call clinic at any time with any questions, concerns, or complaints.    Cancer Staging  No matching staging information was found for the patient.  Lloyd Huger, MD   07/07/2021 9:16 AM

## 2021-07-10 ENCOUNTER — Institutional Professional Consult (permissible substitution): Payer: Medicare Other | Admitting: Radiation Oncology

## 2021-07-12 ENCOUNTER — Encounter: Payer: Self-pay | Admitting: Radiation Oncology

## 2021-07-12 ENCOUNTER — Ambulatory Visit (INDEPENDENT_AMBULATORY_CARE_PROVIDER_SITE_OTHER): Payer: Medicare Other | Admitting: *Deleted

## 2021-07-12 ENCOUNTER — Ambulatory Visit
Admission: RE | Admit: 2021-07-12 | Discharge: 2021-07-12 | Disposition: A | Discharge status: Home / Self Care | Payer: Medicare Other | Source: Ambulatory Visit | Attending: Radiation Oncology | Admitting: Radiation Oncology

## 2021-07-12 ENCOUNTER — Inpatient Hospital Stay: Payer: Medicare Other | Attending: Oncology | Admitting: Oncology

## 2021-07-12 ENCOUNTER — Other Ambulatory Visit: Payer: Self-pay

## 2021-07-12 ENCOUNTER — Encounter: Payer: Self-pay | Admitting: Oncology

## 2021-07-12 ENCOUNTER — Inpatient Hospital Stay: Payer: Medicare Other

## 2021-07-12 DIAGNOSIS — F17210 Nicotine dependence, cigarettes, uncomplicated: Secondary | ICD-10-CM | POA: Insufficient documentation

## 2021-07-12 DIAGNOSIS — M79604 Pain in right leg: Secondary | ICD-10-CM | POA: Insufficient documentation

## 2021-07-12 DIAGNOSIS — M899 Disorder of bone, unspecified: Secondary | ICD-10-CM | POA: Insufficient documentation

## 2021-07-12 DIAGNOSIS — Z79899 Other long term (current) drug therapy: Secondary | ICD-10-CM | POA: Insufficient documentation

## 2021-07-12 DIAGNOSIS — C61 Malignant neoplasm of prostate: Secondary | ICD-10-CM | POA: Insufficient documentation

## 2021-07-12 DIAGNOSIS — Z85820 Personal history of malignant melanoma of skin: Secondary | ICD-10-CM | POA: Insufficient documentation

## 2021-07-12 DIAGNOSIS — R972 Elevated prostate specific antigen [PSA]: Secondary | ICD-10-CM | POA: Diagnosis not present

## 2021-07-12 MED ORDER — NUBEQA 300 MG PO TABS
600.0000 mg | ORAL_TABLET | Freq: Two times a day (BID) | ORAL | 6 refills | Status: AC
Start: 2021-07-12 — End: 2021-08-11

## 2021-07-12 MED ORDER — DEGARELIX ACETATE(240 MG DOSE) 120 MG/VIAL ~~LOC~~ SOLR
240.0000 mg | Freq: Once | SUBCUTANEOUS | Status: AC
  Administered 2021-07-12: 240 mg via SUBCUTANEOUS

## 2021-07-12 NOTE — Progress Notes (Signed)
Firmagon Sub Q Injection ? ?Due to Prostate Cancer patient is present today for a Firmagon Injection.  ? ?Medication: Mills Koller (Degarelix)  ?Dose: '120mg'$  ?Location: right and left upper abdomen ?Lot: [id number] ?Exp: 09/28/23 ? ?Patient tolerated well, no complications were noted ? ?Performed by: Verlene Mayer, [place], RMA ? ? ?

## 2021-07-12 NOTE — Consult Note (Signed)
?NEW PATIENT EVALUATION ? ?Name: Howard Stafford  ?MRN: [id number]  ?Date:   07/12/2021     ?DOB: November 20, 1945 ? ? ?This 76 y.o. 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 ? ?REFERRING PHYSICIAN: Albina Billet, MD ? ?CHIEF COMPLAINT:  ?Chief Complaint  ?Patient presents with  ? Prostate Cancer  ?  Initial consultation  ? ? ?DIAGNOSIS: The encounter diagnosis was Prostate cancer ([place]). ?  ?PREVIOUS INVESTIGATIONS:  ?PET CT scan reviewed CT scans reviewed ?Clinical notes reviewed ?Pathology reports reviewed ? ?HPI: Patient is a 76 year old male who on work-up by his PMD was noted to have a markedly elevated PSA in the 92 range.  On rectal exam he had grossly abnormal prostate with induration of the right mid gland as well as questionable nodules on the left.  He underwent transrectal ultrasound-guided biopsy which was complicated by vasovagal episode although he recovered nicely.  10 of 12 cores were positive for adenocarcinoma a mixture of Gleason 7 (4+3) Gleason 6 and Gleason 73+4).  He had a PSMA PET scan performed showing intense radiotracer radiotracer activity in the left right lobe of the prostate consistent with high-grade prostate carcinoma.  He also multiple large lymph nodes primarily in the median rectum of the pelvis.  There was a single retroperitoneal periaortic node just above the bifurcation.  No evidence of visceral metastasis.  There is also a T7 lesion which favored a Schmorl's node although thoracic MRI scan has been ordered.  Patient is fairly asymptomatic specifically denies any increased lower urinary tract symptoms nocturia or bone pain.  He has been having some right leg pain and is scheduled to see vascular surgeon for possible stent placement secondary to claudication.  He is now referred to [facility] for opinion. ? ?PLANNED TREATMENT REGIMEN: IMRT radiation therapy to prostate and pelvic  nodes ? ?PAST MEDICAL HISTORY:  has a past medical history of Prostate cancer ([place]) and Skin cancer (face).   ? ?PAST SURGICAL HISTORY:  ?Past Surgical History:  ?Procedure Laterality Date  ? BACK SURGERY    ? 04/30/1984 - 04/29/1985  ? EYE SURGERY Left 04/08/2020  ? TRANSFER ADJACENT TISSUE EYELID, NOSE, EAR AND/OR LIP  ? MOHS SURGERY    ? 04/30/2009 - 04/29/2010  ? skin cancer removal  2015  ? 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 ? ?FAMILY HISTORY: family history is not on file. ? ?SOCIAL HISTORY:  reports that he has been smoking cigarettes. He has a 50.00 pack-year smoking history. He has been exposed to tobacco smoke. He has never used smokeless tobacco. He reports current alcohol use. He reports that he does not use drugs. ? ?ALLERGIES: Patient has no known allergies. ? ?MEDICATIONS:  ?Current Outpatient Medications  ?Medication Sig Dispense Refill  ? lisinopril (ZESTRIL) 5 MG tablet Take 5 mg by mouth daily.    ? darolutamide (NUBEQA) 300 MG tablet Take 2 tablets (600 mg total) by mouth 2 (two) times daily with a meal. (Patient not taking: Reported on 07/12/2021) 120 tablet 6  ? ?No current facility-administered medications for this encounter.  ? ? ?ECOG PERFORMANCE STATUS:  0 - Asymptomatic ? ?REVIEW OF SYSTEMS: ?Patient denies any weight loss, fatigue, weakness, fever, chills or night sweats. Patient denies any loss of vision, blurred vision. Patient denies any ringing  of the ears or hearing loss. No irregular heartbeat. Patient denies heart murmur or history of  fainting. Patient denies any chest pain or pain radiating to her upper extremities. Patient denies any shortness of breath, difficulty breathing at night, cough or hemoptysis. Patient denies any swelling in the lower legs. Patient denies any nausea vomiting, vomiting of blood, or coffee ground material in the vomitus. Patient denies any stomach pain. Patient states has had normal bowel movements no significant constipation or diarrhea. Patient denies any  dysuria, hematuria or significant nocturia. Patient denies any problems walking, swelling in the joints or loss of balance. Patient denies any skin changes, loss of hair or loss of weight. Patient denies any excessive worrying or anxiety or significant depression. Patient denies any problems with insomnia. Patient denies excessive thirst, polyuria, polydipsia. Patient denies any swollen glands, patient denies easy bruising or easy bleeding. Patient denies any recent infections, allergies or URI. Patient "s visual fields have not changed significantly in recent time. ?  ?PHYSICAL EXAM: ?BP (!) (P) 146/71 (BP Location: Left Arm, Patient Position: Sitting)   Pulse (P) 76   Temp (!) (P) 96 ?F (35.6 ?C) (Tympanic)   Resp (P) 20   Ht (P) '5\' 10"'$  (1.778 m)   Wt (P) 192 lb 9.6 oz (87.4 kg)   BMI (P) 27.64 kg/m?  ?Well-developed well-nourished patient in NAD. HEENT reveals PERLA, EOMI, discs not visualized.  Oral cavity is clear. No oral mucosal lesions are identified. Neck is clear without evidence of cervical or supraclavicular adenopathy. Lungs are clear to A&P. Cardiac examination is essentially unremarkable with regular rate and rhythm without murmur rub or thrill. Abdomen is benign with no organomegaly or masses noted. Motor sensory and DTR levels are equal and symmetric in the upper and lower extremities. Cranial nerves II through XII are grossly intact. Proprioception is intact. No peripheral adenopathy or edema is identified. No motor or sensory levels are noted. Crude visual fields are within normal range. ? ?LABORATORY DATA: Pathology reports reviewed ? ?  ?RADIOLOGY RESULTS: PET scan reviewed MRI of thoracic spine to be reviewed. ? ? ?IMPRESSION: Stage IVa Gleason 7 (4+3) adenocarcinoma the prostate presenting with a PSA of 76 and 76 year old male ? ?PLAN: At this time I can Compass all areas of PET positive lymph node involvement and in my radiation fields.  I would plan on delivering 29 Pearline Cables to his  prostate.  I also would target his involved nodes to 60 Gray as well as 24 Gray to his other lymph nodes in the pelvic region using IMRT dose painting technique.  I have also asked the patient to start ADT therapy which she would benefit from from at least 2 to 3 years.  Should his bone lesion be suspicious for metastatic disease would also treat that area with SBRT after completion of his pelvic radiation.  Risks and benefits of treatment including skin reaction fatigue alteration of blood counts increased lower Neri tract symptoms possible diarrhea all were discussed in detail with the patient.  He comprehends our treatment plan well.  I have asked Dr. Erlene Quan to place fiducial markers for daily image guided treatment.  Also will be having his ADT should injection I think this week.  I will schedule CT simulation after his markers are placed. ? ?I would like to take this opportunity to thank you for allowing me to participate in the care of your patient.. ? ?Noreene Filbert, MD ? ? ? ? ? ? ? ? ?

## 2021-07-12 NOTE — Patient Instructions (Signed)
Degarelix injection ?What is this medication? ?DEGARELIX (deg a REL ix) is used to treat men with advanced prostate cancer. ?This medicine may be used for other purposes; ask your health care provider or pharmacist if you have questions. ?COMMON BRAND NAME(S): Degarelix, Mills Koller ?What should I tell my care team before I take this medication? ?They need to know if you have any of these conditions: ?diabetes ?heart disease ?kidney disease ?liver disease ?low levels of potassium or magnesium in the blood ?osteoporosis ?an unusual or allergic reaction to degarelix, mannitol, other medicines, foods, dyes, or preservatives ?pregnant or trying to get pregnant ?breast-feeding ?How should I use this medication? ?This medicine is for injection under the skin. It is usually given by a health care professional in a hospital or clinic setting. ?If you get this medicine at home, you will be taught how to prepare and give this medicine. Use exactly as directed. Take your medicine at regular intervals. Do not take it more often than directed. ?It is important that you put your used needles and syringes in a special sharps container. Do not put them in a trash can. If you do not have a sharps container, call your pharmacist or healthcare provider to get one. ?Talk to your pediatrician regarding the use of this medicine in children. Special care may be needed. ?Overdosage: If you think you have taken too much of this medicine contact a poison control center or emergency room at once. ?NOTE: This medicine is only for you. Do not share this medicine with others. ?What if I miss a dose? ?Try not to miss a dose. If you do miss a dose, call your doctor or health care professional for advice. ?What may interact with this medication? ?Do not take this medicine with any of the following medications: ?cisapride ?dronedarone ?pimozide ?thioridazine ?This medicine may also interact with the following medications: ?other medicines that prolong  the QT interval (abnormal heart rhythm) ?This list may not describe all possible interactions. Give your health care provider a list of all the medicines, herbs, non-prescription drugs, or dietary supplements you use. Also tell them if you smoke, drink alcohol, or use illegal drugs. Some items may interact with your medicine. ?What should I watch for while using this medication? ?Visit your doctor or health care professional for regular checks on your progress and discuss any issues before you start taking this medicine. ?Do not rub or scratch injection site. There may be a lump at the injection site, or it may be red or sore for a few days after your dose. ?Your doctor or health care professional will need to monitor your hormone levels in your blood to check your response to treatment. Try to keep any appointments for testing. ?What side effects may I notice from receiving this medication? ?Side effects that you should report to your doctor or health care professional as soon as possible: ?allergic reactions like skin rash, itching or hives, swelling of the face, lips, or tongue ?fever or chills ?irregular heartbeat ?nausea and vomiting along with severe abdominal pain ?pain or difficulty passing urine ?pelvic pain or bloating ?signs and symptoms of high blood sugar such as being more thirsty or hungry or having to urinate more than normal. You may also feel very tired or have blurry vision ?Side effects that usually do not require medical attention (report to your doctor or health care professional if they continue or are bothersome): ?change in sex drive or performance ?constipation ?headache ?high blood pressure ?hot flashes (  flushing of skin, increased sweating) ?itching, redness or mild pain at site where injected ?joint pain ?trouble sleeping ?unusually weak or tired ?weight gain ?This list may not describe all possible side effects. Call your doctor for medical advice about side effects. You may report side  effects to FDA at 1-800-FDA-1088. ?Where should I keep my medication? ?Keep out of the reach of children. ?This drug is usually given in a hospital or clinic and will not be stored at home. ?In rare cases, this medicine may be given at home. If you are using this medicine at home, you will be instructed on how to store this medicine. Throw away any unused medicine after the expiration date on the label. ?NOTE: This sheet is a summary. It may not cover all possible information. If you have questions about this medicine, talk to your doctor, pharmacist, or health care provider. ?? 2022 Elsevier/Gold Standard (2021-01-03 00:00:00) ? ?

## 2021-07-12 NOTE — Consult Note (Signed)
bbbb

## 2021-07-12 NOTE — Telephone Encounter (Signed)
Fax received from [facility] they are unable to fill Nubeqa due to it being a LDD. Medication was refaxed to [facility]. P: [phone]. F: [phone]. Verbal given and rx faxed ?

## 2021-07-14 DIAGNOSIS — H34812 Central retinal vein occlusion, left eye, with macular edema: Secondary | ICD-10-CM | POA: Diagnosis not present

## 2021-07-16 ENCOUNTER — Ambulatory Visit
Admission: RE | Admit: 2021-07-16 | Discharge: 2021-07-16 | Disposition: A | Discharge status: Home / Self Care | Payer: Medicare Other | Source: Ambulatory Visit | Attending: Urology | Admitting: Urology

## 2021-07-16 ENCOUNTER — Other Ambulatory Visit: Payer: Self-pay

## 2021-07-16 DIAGNOSIS — C61 Malignant neoplasm of prostate: Secondary | ICD-10-CM | POA: Diagnosis not present

## 2021-07-16 DIAGNOSIS — M40204 Unspecified kyphosis, thoracic region: Secondary | ICD-10-CM | POA: Diagnosis not present

## 2021-07-16 MED ORDER — GADOBUTROL 1 MMOL/ML IV SOLN
7.5000 mL | Freq: Once | INTRAVENOUS | Status: AC | PRN
  Administered 2021-07-16: 7.5 mL via INTRAVENOUS

## 2021-07-17 ENCOUNTER — Other Ambulatory Visit (INDEPENDENT_AMBULATORY_CARE_PROVIDER_SITE_OTHER): Payer: Self-pay | Admitting: Vascular Surgery

## 2021-07-17 DIAGNOSIS — I739 Peripheral vascular disease, unspecified: Secondary | ICD-10-CM

## 2021-07-17 NOTE — Telephone Encounter (Signed)
[facility] has been trying to reach patient and unable to reach pt for insurance information. We received a fax from [facility] regarding Nubeqa asking for further information was sent via fax. Awaiting response ?

## 2021-07-18 ENCOUNTER — Ambulatory Visit (INDEPENDENT_AMBULATORY_CARE_PROVIDER_SITE_OTHER): Payer: Medicare Other

## 2021-07-18 ENCOUNTER — Ambulatory Visit (INDEPENDENT_AMBULATORY_CARE_PROVIDER_SITE_OTHER): Payer: Medicare Other | Admitting: Vascular Surgery

## 2021-07-18 ENCOUNTER — Telehealth: Payer: Self-pay | Admitting: *Deleted

## 2021-07-18 ENCOUNTER — Other Ambulatory Visit: Payer: Self-pay

## 2021-07-18 ENCOUNTER — Encounter (INDEPENDENT_AMBULATORY_CARE_PROVIDER_SITE_OTHER): Payer: Self-pay | Admitting: Vascular Surgery

## 2021-07-18 VITALS — BP 152/81 | HR 77 | Resp 16 | Ht 70.0 in | Wt 190.0 lb

## 2021-07-18 DIAGNOSIS — I739 Peripheral vascular disease, unspecified: Secondary | ICD-10-CM

## 2021-07-18 DIAGNOSIS — I70211 Atherosclerosis of native arteries of extremities with intermittent claudication, right leg: Secondary | ICD-10-CM

## 2021-07-18 DIAGNOSIS — Z720 Tobacco use: Secondary | ICD-10-CM | POA: Diagnosis not present

## 2021-07-18 DIAGNOSIS — C61 Malignant neoplasm of prostate: Secondary | ICD-10-CM | POA: Diagnosis not present

## 2021-07-18 DIAGNOSIS — I70219 Atherosclerosis of native arteries of extremities with intermittent claudication, unspecified extremity: Secondary | ICD-10-CM | POA: Insufficient documentation

## 2021-07-18 NOTE — Assessment & Plan Note (Signed)
Sounds like this is fairly advanced.  About to undergo treatment for his prostate cancer 2, and would like to get his legs treated prior to this if at all possible due to the immobility and difficulty with the leg pain at this point. ?

## 2021-07-18 NOTE — Assessment & Plan Note (Signed)
ABIs were done today which revealed a right ABI of 0.72 and a left ABI of 0.74 with a right TBI of 0.51 and a left TBI of 0.59.   ? ?Recommend: ? ?The patient has experienced increased symptoms and is now describing lifestyle limiting claudication and mild rest pain.  This is predominantly in the right leg. ? ? ?Given the severity of the patient's right lower extremity symptoms the patient should undergo angiography and intervention.  Risk and benefits were reviewed the patient.  Indications for the procedure were reviewed.  All questions were answered, the patient agrees to proceed.  ? ?The patient should continue walking and begin a more formal exercise program.  ?The patient should continue antiplatelet therapy and aggressive treatment of the lipid abnormalities ? ?The patient will follow up with me after the angiogram.  ?

## 2021-07-18 NOTE — Patient Instructions (Signed)
Peripheral Vascular Disease ?Peripheral vascular disease (PVD) is a disease of the blood vessels that carry blood from the heart to the rest of the body. PVD is also called peripheral artery disease (PAD) or poor circulation. PVD affects most of the body. But it affects the legs and feet the most. ?PVD can lead to acute limb ischemia. This happens when there is a sudden stop of blood flow to an arm or leg. This is a medical emergency. ?What are the causes? ?The most common cause of PVD is a buildup of a fatty substance (plaque) inside your arteries. This decreases blood flow. Plaque can break off and block blood in a smaller artery. This can lead to acute limb ischemia. ?Other common causes of PVD include: ?Blood clots inside the blood vessels. ?Injuries to blood vessels. ?Irritation and swelling of blood vessels. ?Sudden tightening of the blood vessel (spasms). ?What increases the risk? ?A family history of PVD. ?Medical conditions, including: ?High cholesterol. ?Diabetes. ?High blood pressure. ?Heart disease. ?Past problems with blood clots. ?Past injury, such as burns or a broken bone. ?Other conditions, such as: ?Buerger's disease. This is caused by swollen or irritated blood vessels in your hands and feet. ?Arthritis. ?Birth defects that affect the arteries in your legs. ?Kidney disease. ?Using tobacco or nicotine products. ?Not getting enough exercise. ?Being very overweight (obese). ?Being 50 years old or older. ?What are the signs or symptoms? ?Cramps in your butt, legs, and feet. ?Pain and weakness in your legs when you are active that goes away when you rest. ?Leg pain when at rest. ?Leg numbness, tingling, or weakness. ?Coldness in a leg or foot, especially when compared with the other leg or foot. ?Skin or hair changes. These can include: ?Hair loss. ?Shiny skin. ?Pale or bluish skin. ?Thick toenails. ?Being unable to get or keep an erection. ?Tiredness (fatigue). ?Weak pulse or no pulse in the  feet. ?Wounds and sores on the toes, feet, or legs. These take longer to heal. ?How is this treated? ?Underlying causes are treated first. Other conditions, like diabetes, high cholesterol, and blood pressure, are also treated. Treatment may include: ?Lifestyle changes, such as: ?Quitting smoking. ?Getting regular exercise. ?Having a diet low in fat and cholesterol. ?Not drinking alcohol. ?Taking medicines, such as: ?Blood thinners. ?Medicines to improve blood flow. ?Medicines to improve your blood cholesterol. ?Procedures to: ?Open the arteries and restore blood flow. ?Insert a small mesh tube (stent) to keep a blocked vessel open. ?Create a new path for blood to flow to the body (peripheral bypass). ?Remove dead tissue from a wound. ?Remove an affected leg or arm. ?Follow these instructions at home: ?Medicines ?Take over-the-counter and prescription medicines only as told by your doctor. ?If you are taking blood thinners: ?Talk with your doctor before you take any medicines that have aspirin, or NSAIDs, such as ibuprofen. ?Take medicines exactly as told. Take them at the same time each day. ?Avoid doing things that could hurt or bruise you. Take action to prevent falls. ?Wear an alert bracelet or carry a card that shows you are taking blood thinners. ?Lifestyle ?  ?Get regular exercise. Ask your doctor about how to stay active. ?Talk with your doctor about keeping a healthy weight. If needed, ask about losing weight. ?Eat a diet that is low in fat and cholesterol. If you need help, talk with your doctor. ?Do not drink alcohol. ?Do not smoke or use any products that contain nicotine or tobacco. If you need help quitting, ask your   doctor. ?General instructions ?Take good care of your feet. To do this: ?Wear shoes that fit well and feel good. ?Check your feet often for any cuts or sores. ?Get a flu shot (influenza vaccine) each year. ?Keep all follow-up visits. ?Where to find more information ?Society for Vascular  Surgery: vascular.org ?American Heart Association: heart.org ?National Heart, Lung, and Blood Institute: nhlbi.nih.gov ?Contact a doctor if: ?You have cramps in your legs when you walk. ?You have leg pain when you rest. ?Your leg or foot feels cold. ?Your skin changes. ?You cannot get or keep an erection. ?You have cuts or sores on your legs or feet that do not heal. ?Get help right away if: ?You have sudden changes in the color and feeling of your arms or legs, such as: ?Your arm or leg turns cold, numb, and blue. ?Your arm or leg becomes red, warm, swollen, painful, or numb. ?You have any signs of a stroke. "BE FAST" is an easy way to remember the main warning signs: ?B - Balance. Dizziness, sudden trouble walking, or loss of balance. ?E - Eyes. Trouble seeing or a change in how you see. ?F - Face. Sudden weakness or loss of feeling of the face. The face or eyelid may droop on one side. ?A - Arms. Weakness or loss of feeling in an arm. This happens all of a sudden and most often on one side of the body. ?S - Speech. Sudden trouble speaking, slurred speech, or trouble understanding what people say. ?T - Time. Time to call emergency services. Write down what time symptoms started. ?You have other signs of a stroke, such as: ?A sudden, very bad headache with no known cause. ?Feeling like you may vomit (nausea). ?Vomiting. ?A seizure. ?You have chest pain or trouble breathing. ?These symptoms may be an emergency. Get help right away. Call your local emergency services (911 in the [place] ?Do not wait to see if the symptoms will go away. ?Do not drive yourself to the hospital. ?Summary ?Peripheral vascular disease (PVD) is a disease of the blood vessels. ?PVD affects the legs and feet the most. ?Symptoms may include leg pain or leg numbness, tingling, and weakness. ?Treatment may include lifestyle changes, medicines, and procedures. ?This information is not intended to replace advice given to you by your health care  provider. Make sure you discuss any questions you have with your health care provider. ?Document Revised: 10/19/2019 Document Reviewed: 10/19/2019 ?Elsevier Patient Education ? 2022 Elsevier Inc. ? ?

## 2021-07-18 NOTE — Progress Notes (Signed)
? ? ?Patient ID: Howard Stafford, male   DOB: 12-Oct-1945, 76 y.o.   MRN: [id number] ? ?Chief Complaint  ?Patient presents with  ? Establish Care  ?  Referred by Dr Caryl Pina   ? ? ?HPI ?Howard Stafford is a 76 y.o. male.  I am asked to see the patient by Dr. Erlene Quan and Dr. Hall Busing for evaluation of claudication symptoms predominantly in the right lower extremity.  This has been a steadily progressive problem over the past 8 to 10 months.  Over the past 3 to 4 months, it has become much more disabling and bothersome.  He denies any clear antecedent history or inciting event that caused the symptoms.  The right leg is the predominantly affected leg.  It does not bother him much at rest, although with short distances of walking he gets severe cramping in his right calf and thigh area and he has to stop.  The symptoms really are disabling, and have now made it to where he cannot work anymore.  Stopping generally relieves the pain.  No ulceration or infection.  He is also about to undergo treatment for prostate cancer.  ABIs were done today which revealed a right ABI of 0.72 and a left ABI of 0.74 with a right TBI of 0.51 and a left TBI of 0.59.   ? ? ?Past Medical History:  ?Diagnosis Date  ? Prostate cancer ([place])   ? Skin cancer face  ? Basal cell carcinoma of nose  ? ? ?Past Surgical History:  ?Procedure Laterality Date  ? BACK SURGERY    ? 04/30/1984 - 04/29/1985  ? EYE SURGERY Left 04/08/2020  ? TRANSFER ADJACENT TISSUE EYELID, NOSE, EAR AND/OR LIP  ? MOHS SURGERY    ? 04/30/2009 - 04/29/2010  ? skin cancer removal  2015  ? 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 ? ? ?Family History ?No bleeding disorders, clotting disorders, autoimmune diseases, or aneurysms ? ? ?Social History  ? ?Tobacco Use  ? Smoking status: Every Day  ?  Packs/day: 1.00  ?  Years: 50.00  ?  Pack years: 50.00  ?  Types: Cigarettes  ?  Passive exposure: Current  ? Smokeless tobacco: Never  ?Substance Use Topics  ? Alcohol use: Yes  ?  Comment: Everynight Barnabas Lister  daniels  ? Drug use: No  ? ? ? ?No Known Allergies ? ?Current Outpatient Medications  ?Medication Sig Dispense Refill  ? lisinopril (ZESTRIL) 10 MG tablet Take 10 mg by mouth daily.    ? darolutamide (NUBEQA) 300 MG tablet Take 2 tablets (600 mg total) by mouth 2 (two) times daily with a meal. (Patient not taking: Reported on 07/12/2021) 120 tablet 6  ? ?No current facility-administered medications for this visit.  ? ? ? ? ?REVIEW OF SYSTEMS (Negative unless checked) ? ?Constitutional: '[]'$ Weight loss  '[]'$ Fever  '[]'$ Chills ?Cardiac: '[]'$ Chest pain   '[]'$ Chest pressure   '[]'$ Palpitations   '[]'$ Shortness of breath when laying flat   '[]'$ Shortness of breath at rest   '[]'$ Shortness of breath with exertion. ?Vascular:  '[x]'$ Pain in legs with walking   '[]'$ Pain in legs at rest   '[]'$ Pain in legs when laying flat   '[x]'$ Claudication   '[]'$ Pain in feet when walking  '[]'$ Pain in feet at rest  '[]'$ Pain in feet when laying flat   '[]'$ History of DVT   '[]'$ Phlebitis   '[]'$ Swelling in legs   '[]'$ Varicose veins   '[]'$ Non-healing ulcers ?Pulmonary:   '[]'$ Uses home oxygen   '[]'$ Productive cough   '[]'$   Hemoptysis   '[]'$ Wheeze  '[]'$ COPD   '[]'$ Asthma ?Neurologic:  '[]'$ Dizziness  '[]'$ Blackouts   '[]'$ Seizures   '[]'$ History of stroke   '[]'$ History of TIA  '[]'$ Aphasia   '[]'$ Temporary blindness   '[]'$ Dysphagia   '[]'$ Weakness or numbness in arms   '[]'$ Weakness or numbness in legs ?Musculoskeletal:  '[x]'$ Arthritis   '[]'$ Joint swelling   '[]'$ Joint pain   '[]'$ Low back pain ?Hematologic:  '[]'$ Easy bruising  '[]'$ Easy bleeding   '[]'$ Hypercoagulable state   '[]'$ Anemic  '[]'$ Hepatitis ?Gastrointestinal:  '[]'$ Blood in stool   '[]'$ Vomiting blood  '[]'$ Gastroesophageal reflux/heartburn   '[]'$ Abdominal pain ?Genitourinary:  '[]'$ Chronic kidney disease   '[]'$ Difficult urination  '[x]'$ Frequent urination  '[]'$ Burning with urination   '[]'$ Hematuria ?Skin:  '[]'$ Rashes   '[]'$ Ulcers   '[]'$ Wounds ?Psychological:  '[]'$ History of anxiety   '[]'$  History of major depression. ? ? ? ?Physical Exam ?BP (!) 152/81 (BP Location: Right Arm)   Pulse 77   Resp 16   Ht '5\' 10"'$  (1.778 m)   Wt  190 lb (86.2 kg)   BMI 27.26 kg/m?  ?Gen:  WD/WN, NAD ?Head: [place]/AT, No temporalis wasting.  ?Ear/Nose/Throat: Hearing grossly intact, nares w/o erythema or drainage, oropharynx w/o Erythema/Exudate ?Eyes: Conjunctiva clear, sclera non-icteric  ?Neck: trachea midline.  No JVD.  ?Pulmonary:  Good air movement, respirations not labored, no use of accessory muscles  ?Cardiac: RRR, no JVD ?Vascular:  ?Vessel Right Left  ?Radial Palpable Palpable  ?    ?    ?    ?    ?    ?    ?PT 1+ 1+  ?DP 1+ 1+  ? ?Gastrointestinal:. No masses, surgical incisions, or scars. ?Musculoskeletal: M/S 5/5 throughout.  Extremities without ischemic changes.  No deformity or atrophy.  Slightly sluggish capillary refill bilaterally.  Trace lower extremity edema. ?Neurologic: Sensation grossly intact in extremities.  Symmetrical.  Speech is fluent. Motor exam as listed above. ?Psychiatric: Judgment intact, Mood & affect appropriate for pt's clinical situation. ?Dermatologic: No rashes or ulcers noted.  No cellulitis or open wounds. ? ? ? ?Radiology ?MR THORACIC SPINE W WO CONTRAST ? ?Result Date: 07/18/2021 ?CLINICAL DATA:  76 year old male with prostate cancer. Abnormal T7 vertebral activity on PET-CT EXAM: MRI THORACIC WITHOUT AND WITH CONTRAST TECHNIQUE: Multiplanar and multiecho pulse sequences of the thoracic spine were obtained without and with intravenous contrast. CONTRAST:  7.12m GADAVIST GADOBUTROL 1 MMOL/ML IV SOLN COMPARISON:  PET-CT 06/28/2021. FINDINGS: Limited cervical spine imaging: C5-C6 and C6-C7 chronic disc and endplate degeneration with possible mild degenerative spinal stenosis there. Thoracic spine segmentation: Appears to be normal. Although the lesion identified on the comparison is in the T6 inferior endplate (not T7 as reported, confirmed on series 606 of that exam). Alignment: Mildly exaggerated thoracic kyphosis. Subtle degenerative anterolisthesis of T3 on T4. Vertebrae: Benign L1 vertebral body hemangioma.  Background bone marrow signal is within normal limits. T6 inferior endplate left of midline small roughly 7 mm round lesion (series 20, image 11, series 19, image 11) which does most resemble a Schmorl's node on axial images (series 24, image 20) is associated with patchy acute marrow edema and enhancement (series 21 and series 26, image 11, respectively). Elsewhere only faint anterior endplate degenerative marrow edema at T11 and T12. No other marrow edema or abnormal vertebral enhancement. No suspicious osseous lesion identified. Cord: Normal. Capacious spinal canal at most levels. Normal conus medullaris. No abnormal intradural enhancement or dural thickening. Paraspinal and other soft tissues: Negative. Disc levels: No age advanced degenerative changes in the thoracic  spine. No thoracic spinal stenosis. Notable degenerative findings: Moderate facet degeneration with hypertrophy at T3-T4 does not result in spinal stenosis, but mild to moderate right T3 neural foraminal stenosis. T10-T11 small right paracentral disc protrusion with mild endplate spurring. Mild right side facet hypertrophy. Mild to moderate right T10 neural foraminal stenosis. IMPRESSION: 1. Thoracic vertebral body lesion seen on PET is clarified to be in the T6 inferior endplate (not T7). It does most resemble a degenerative Schmorl's node by MRI. A repeat Thoracic MRI (noncontrast probably would suffice) in 6 months can be obtained to confirm stability. 2. No metastatic disease identified in the thoracic spine. And generally mild for age thoracic spine degeneration with no spinal stenosis. Electronically Signed   By: Genevie Ann M.D.   On: 07/18/2021 10:09  ? ?NM PET (PSMA) SKULL TO MID THIGH ? ?Result Date: 06/29/2021 ?CLINICAL DATA:  Prostate carcinoma with biochemical recurrence. EXAM: NUCLEAR MEDICINE PET SKULL BASE TO THIGH TECHNIQUE: 8.9 mCi F18 Piflufolastat (Pylarify) was injected intravenously. Full-ring PET imaging was performed from the  skull base to thigh after the radiotracer. CT data was obtained and used for attenuation correction and anatomic localization. COMPARISON:  Pelvic CT 06/14/2021 FINDINGS: NECK No radiotracer activity in neck lymph nod

## 2021-07-18 NOTE — H&P (View-Only) (Signed)
? ? ?Patient ID: Howard Stafford, male   DOB: 1946/01/26, 75 y.o.   MRN: [id number] ? ?Chief Complaint  ?Patient presents with  ? Establish Care  ?  Referred by Dr Caryl Pina   ? ? ?HPI ?Howard Stafford is a 76 y.o. male.  I am asked to see the patient by Dr. Erlene Quan and Dr. Hall Busing for evaluation of claudication symptoms predominantly in the right lower extremity.  This has been a steadily progressive problem over the past 8 to 10 months.  Over the past 3 to 4 months, it has become much more disabling and bothersome.  He denies any clear antecedent history or inciting event that caused the symptoms.  The right leg is the predominantly affected leg.  It does not bother him much at rest, although with short distances of walking he gets severe cramping in his right calf and thigh area and he has to stop.  The symptoms really are disabling, and have now made it to where he cannot work anymore.  Stopping generally relieves the pain.  No ulceration or infection.  He is also about to undergo treatment for prostate cancer.  ABIs were done today which revealed a right ABI of 0.72 and a left ABI of 0.74 with a right TBI of 0.51 and a left TBI of 0.59.   ? ? ?Past Medical History:  ?Diagnosis Date  ? Prostate cancer ([place])   ? Skin cancer face  ? Basal cell carcinoma of nose  ? ? ?Past Surgical History:  ?Procedure Laterality Date  ? BACK SURGERY    ? 04/30/1984 - 04/29/1985  ? EYE SURGERY Left 04/08/2020  ? TRANSFER ADJACENT TISSUE EYELID, NOSE, EAR AND/OR LIP  ? MOHS SURGERY    ? 04/30/2009 - 04/29/2010  ? skin cancer removal  2015  ? 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 ? ? ?Family History ?No bleeding disorders, clotting disorders, autoimmune diseases, or aneurysms ? ? ?Social History  ? ?Tobacco Use  ? Smoking status: Every Day  ?  Packs/day: 1.00  ?  Years: 50.00  ?  Pack years: 50.00  ?  Types: Cigarettes  ?  Passive exposure: Current  ? Smokeless tobacco: Never  ?Substance Use Topics  ? Alcohol use: Yes  ?  Comment: Everynight Howard Stafford  ? Drug use: No  ? ? ? ?No Known Allergies ? ?Current Outpatient Medications  ?Medication Sig Dispense Refill  ? lisinopril (ZESTRIL) 10 MG tablet Take 10 mg by mouth daily.    ? darolutamide (NUBEQA) 300 MG tablet Take 2 tablets (600 mg total) by mouth 2 (two) times daily with a meal. (Patient not taking: Reported on 07/12/2021) 120 tablet 6  ? ?No current facility-administered medications for this visit.  ? ? ? ? ?REVIEW OF SYSTEMS (Negative unless checked) ? ?Constitutional: '[]'$ Weight loss  '[]'$ Fever  '[]'$ Chills ?Cardiac: '[]'$ Chest pain   '[]'$ Chest pressure   '[]'$ Palpitations   '[]'$ Shortness of breath when laying flat   '[]'$ Shortness of breath at rest   '[]'$ Shortness of breath with exertion. ?Vascular:  '[x]'$ Pain in legs with walking   '[]'$ Pain in legs at rest   '[]'$ Pain in legs when laying flat   '[x]'$ Claudication   '[]'$ Pain in feet when walking  '[]'$ Pain in feet at rest  '[]'$ Pain in feet when laying flat   '[]'$ History of DVT   '[]'$ Phlebitis   '[]'$ Swelling in legs   '[]'$ Varicose veins   '[]'$ Non-healing ulcers ?Pulmonary:   '[]'$ Uses home oxygen   '[]'$ Productive cough   '[]'$   Hemoptysis   '[]'$ Wheeze  '[]'$ COPD   '[]'$ Asthma ?Neurologic:  '[]'$ Dizziness  '[]'$ Blackouts   '[]'$ Seizures   '[]'$ History of stroke   '[]'$ History of TIA  '[]'$ Aphasia   '[]'$ Temporary blindness   '[]'$ Dysphagia   '[]'$ Weakness or numbness in arms   '[]'$ Weakness or numbness in legs ?Musculoskeletal:  '[x]'$ Arthritis   '[]'$ Joint swelling   '[]'$ Joint pain   '[]'$ Low back pain ?Hematologic:  '[]'$ Easy bruising  '[]'$ Easy bleeding   '[]'$ Hypercoagulable state   '[]'$ Anemic  '[]'$ Hepatitis ?Gastrointestinal:  '[]'$ Blood in stool   '[]'$ Vomiting blood  '[]'$ Gastroesophageal reflux/heartburn   '[]'$ Abdominal pain ?Genitourinary:  '[]'$ Chronic kidney disease   '[]'$ Difficult urination  '[x]'$ Frequent urination  '[]'$ Burning with urination   '[]'$ Hematuria ?Skin:  '[]'$ Rashes   '[]'$ Ulcers   '[]'$ Wounds ?Psychological:  '[]'$ History of anxiety   '[]'$  History of major depression. ? ? ? ?Physical Exam ?BP (!) 152/81 (BP Location: Right Arm)   Pulse 77   Resp 16   Ht '5\' 10"'$  (1.778 m)   Wt  190 lb (86.2 kg)   BMI 27.26 kg/m?  ?Gen:  WD/WN, NAD ?Head: [place]/AT, No temporalis wasting.  ?Ear/Nose/Throat: Hearing grossly intact, nares w/o erythema or drainage, oropharynx w/o Erythema/Exudate ?Eyes: Conjunctiva clear, sclera non-icteric  ?Neck: trachea midline.  No JVD.  ?Pulmonary:  Good air movement, respirations not labored, no use of accessory muscles  ?Cardiac: RRR, no JVD ?Vascular:  ?Vessel Right Left  ?Radial Palpable Palpable  ?    ?    ?    ?    ?    ?    ?PT 1+ 1+  ?DP 1+ 1+  ? ?Gastrointestinal:. No masses, surgical incisions, or scars. ?Musculoskeletal: M/S 5/5 throughout.  Extremities without ischemic changes.  No deformity or atrophy.  Slightly sluggish capillary refill bilaterally.  Trace lower extremity edema. ?Neurologic: Sensation grossly intact in extremities.  Symmetrical.  Speech is fluent. Motor exam as listed above. ?Psychiatric: Judgment intact, Mood & affect appropriate for pt's clinical situation. ?Dermatologic: No rashes or ulcers noted.  No cellulitis or open wounds. ? ? ? ?Radiology ?MR THORACIC SPINE W WO CONTRAST ? ?Result Date: 07/18/2021 ?CLINICAL DATA:  76 year old male with prostate cancer. Abnormal T7 vertebral activity on PET-CT EXAM: MRI THORACIC WITHOUT AND WITH CONTRAST TECHNIQUE: Multiplanar and multiecho pulse sequences of the thoracic spine were obtained without and with intravenous contrast. CONTRAST:  7.14m GADAVIST GADOBUTROL 1 MMOL/ML IV SOLN COMPARISON:  PET-CT 06/28/2021. FINDINGS: Limited cervical spine imaging: C5-C6 and C6-C7 chronic disc and endplate degeneration with possible mild degenerative spinal stenosis there. Thoracic spine segmentation: Appears to be normal. Although the lesion identified on the comparison is in the T6 inferior endplate (not T7 as reported, confirmed on series 606 of that exam). Alignment: Mildly exaggerated thoracic kyphosis. Subtle degenerative anterolisthesis of T3 on T4. Vertebrae: Benign L1 vertebral body hemangioma.  Background bone marrow signal is within normal limits. T6 inferior endplate left of midline small roughly 7 mm round lesion (series 20, image 11, series 19, image 11) which does most resemble a Schmorl's node on axial images (series 24, image 20) is associated with patchy acute marrow edema and enhancement (series 21 and series 26, image 11, respectively). Elsewhere only faint anterior endplate degenerative marrow edema at T11 and T12. No other marrow edema or abnormal vertebral enhancement. No suspicious osseous lesion identified. Cord: Normal. Capacious spinal canal at most levels. Normal conus medullaris. No abnormal intradural enhancement or dural thickening. Paraspinal and other soft tissues: Negative. Disc levels: No age advanced degenerative changes in the thoracic  spine. No thoracic spinal stenosis. Notable degenerative findings: Moderate facet degeneration with hypertrophy at T3-T4 does not result in spinal stenosis, but mild to moderate right T3 neural foraminal stenosis. T10-T11 small right paracentral disc protrusion with mild endplate spurring. Mild right side facet hypertrophy. Mild to moderate right T10 neural foraminal stenosis. IMPRESSION: 1. Thoracic vertebral body lesion seen on PET is clarified to be in the T6 inferior endplate (not T7). It does most resemble a degenerative Schmorl's node by MRI. A repeat Thoracic MRI (noncontrast probably would suffice) in 6 months can be obtained to confirm stability. 2. No metastatic disease identified in the thoracic spine. And generally mild for age thoracic spine degeneration with no spinal stenosis. Electronically Signed   By: Genevie Ann M.D.   On: 07/18/2021 10:09  ? ?NM PET (PSMA) SKULL TO MID THIGH ? ?Result Date: 06/29/2021 ?CLINICAL DATA:  Prostate carcinoma with biochemical recurrence. EXAM: NUCLEAR MEDICINE PET SKULL BASE TO THIGH TECHNIQUE: 8.9 mCi F18 Piflufolastat (Pylarify) was injected intravenously. Full-ring PET imaging was performed from the  skull base to thigh after the radiotracer. CT data was obtained and used for attenuation correction and anatomic localization. COMPARISON:  Pelvic CT 06/14/2021 FINDINGS: NECK No radiotracer activity in neck lymph nod

## 2021-07-18 NOTE — Telephone Encounter (Addendum)
Patient informed, voiced understanding. ? ? ? ?----- Message from Hollice Espy, MD sent at 07/18/2021 10:37 AM EDT ----- ?MRI of the thoracic spine appears to be benign which is great news.  Our plan remains unchanged. ? ?Hollice Espy, MD ? ?

## 2021-07-18 NOTE — Assessment & Plan Note (Signed)
Represents an atherosclerotic risk factor and likely a predominant reason for his peripheral arterial disease. ?

## 2021-07-19 ENCOUNTER — Ambulatory Visit: Payer: Medicare Other

## 2021-07-19 ENCOUNTER — Telehealth (INDEPENDENT_AMBULATORY_CARE_PROVIDER_SITE_OTHER): Payer: Self-pay

## 2021-07-19 NOTE — Telephone Encounter (Signed)
Spoke with the patient's spouse and the patient is scheduled with Dr. Lucky Cowboy for a right leg angio on 07/24/21 with a 9:30 am arrival time to the [facility]. Pre-procedure instructions were discussed and will be mailed.  ?

## 2021-07-20 ENCOUNTER — Telehealth: Payer: Self-pay

## 2021-07-20 NOTE — Telephone Encounter (Signed)
Received papers for Patient Assistance program for [employer]. Spoke with pt wife and he needs STENTS placed in his leg on Monday. Pt wife states she may be able to come Wednesday to fill out the forms I have for PAP . ?

## 2021-07-24 ENCOUNTER — Ambulatory Visit
Admission: RE | Admit: 2021-07-24 | Discharge: 2021-07-24 | Disposition: A | Discharge status: Home / Self Care | Payer: Medicare Other | Attending: Vascular Surgery | Admitting: Vascular Surgery

## 2021-07-24 ENCOUNTER — Other Ambulatory Visit: Payer: Self-pay

## 2021-07-24 ENCOUNTER — Encounter: Payer: Self-pay | Admitting: Vascular Surgery

## 2021-07-24 ENCOUNTER — Encounter
Admission: RE | Disposition: A | Discharge status: Home / Self Care | Payer: Self-pay | Source: Home / Self Care | Attending: Vascular Surgery

## 2021-07-24 DIAGNOSIS — I743 Embolism and thrombosis of arteries of the lower extremities: Secondary | ICD-10-CM | POA: Diagnosis not present

## 2021-07-24 DIAGNOSIS — I70211 Atherosclerosis of native arteries of extremities with intermittent claudication, right leg: Secondary | ICD-10-CM

## 2021-07-24 DIAGNOSIS — Z79899 Other long term (current) drug therapy: Secondary | ICD-10-CM | POA: Diagnosis not present

## 2021-07-24 DIAGNOSIS — I70219 Atherosclerosis of native arteries of extremities with intermittent claudication, unspecified extremity: Secondary | ICD-10-CM

## 2021-07-24 DIAGNOSIS — C61 Malignant neoplasm of prostate: Secondary | ICD-10-CM | POA: Diagnosis not present

## 2021-07-24 DIAGNOSIS — F17210 Nicotine dependence, cigarettes, uncomplicated: Secondary | ICD-10-CM | POA: Diagnosis not present

## 2021-07-24 DIAGNOSIS — I70223 Atherosclerosis of native arteries of extremities with rest pain, bilateral legs: Secondary | ICD-10-CM | POA: Diagnosis not present

## 2021-07-24 DIAGNOSIS — I7092 Chronic total occlusion of artery of the extremities: Secondary | ICD-10-CM | POA: Diagnosis not present

## 2021-07-24 HISTORY — PX: LOWER EXTREMITY ANGIOGRAPHY: CATH118251

## 2021-07-24 LAB — CREATININE, SERUM
Creatinine, Ser: 0.96 mg/dL (ref 0.61–1.24)
GFR, Estimated: >60 mL/min (ref >60)

## 2021-07-24 LAB — BUN: BUN: 17 mg/dL (ref 8–23)

## 2021-07-24 SURGERY — LOWER EXTREMITY ANGIOGRAPHY
Anesthesia: Moderate Sedation | Site: Leg Lower | Laterality: Right

## 2021-07-24 MED ORDER — ONDANSETRON HCL 4 MG/2ML IJ SOLN: 4.0000 mg | Freq: Four times a day (QID) | INTRAMUSCULAR | Status: DC | PRN

## 2021-07-24 MED ORDER — SODIUM CHLORIDE 0.9 % IV SOLN: INTRAVENOUS | Status: DC

## 2021-07-24 MED ORDER — CEFAZOLIN SODIUM-DEXTROSE 2-4 GM/100ML-% IV SOLN: 2.0000 g | Freq: Once | INTRAVENOUS | Status: AC

## 2021-07-24 MED ORDER — FENTANYL CITRATE (PF) 100 MCG/2ML IJ SOLN
INTRAMUSCULAR | Status: DC | PRN
  Administered 2021-07-24 (×2): 50 ug via INTRAVENOUS

## 2021-07-24 MED ORDER — ATORVASTATIN CALCIUM 10 MG PO TABS: 10.0000 mg | ORAL_TABLET | Freq: Every day | ORAL | 11 refills | Status: DC

## 2021-07-24 MED ORDER — HEPARIN SODIUM (PORCINE) 1000 UNIT/ML IJ SOLN
INTRAMUSCULAR | Status: DC | PRN
  Administered 2021-07-24: 5000 [IU] via INTRAVENOUS

## 2021-07-24 MED ORDER — SODIUM CHLORIDE 0.9% FLUSH: 3.0000 mL | INTRAVENOUS | Status: DC | PRN

## 2021-07-24 MED ORDER — ATORVASTATIN CALCIUM 10 MG PO TABS: 10.0000 mg | ORAL_TABLET | Freq: Every day | ORAL | Status: DC

## 2021-07-24 MED ORDER — CEFAZOLIN SODIUM-DEXTROSE 2-4 GM/100ML-% IV SOLN
INTRAVENOUS | Status: AC
  Administered 2021-07-24: 2 g via INTRAVENOUS
  Filled 2021-07-24: qty 100

## 2021-07-24 MED ORDER — HYDRALAZINE HCL 20 MG/ML IJ SOLN: 5.0000 mg | INTRAMUSCULAR | Status: DC | PRN

## 2021-07-24 MED ORDER — ASPIRIN EC 81 MG PO TBEC: 81.0000 mg | DELAYED_RELEASE_TABLET | Freq: Every day | ORAL | 2 refills | Status: DC

## 2021-07-24 MED ORDER — HEPARIN SODIUM (PORCINE) 1000 UNIT/ML IJ SOLN
INTRAMUSCULAR | Status: DC
Start: 2021-07-24 — End: 2021-07-24
  Filled 2021-07-24: qty 10

## 2021-07-24 MED ORDER — MIDAZOLAM HCL 2 MG/ML PO SYRP: 8.0000 mg | ORAL_SOLUTION | Freq: Once | ORAL | Status: DC | PRN

## 2021-07-24 MED ORDER — SODIUM CHLORIDE 0.9 % IV SOLN: 250.0000 mL | INTRAVENOUS | Status: DC | PRN

## 2021-07-24 MED ORDER — CLOPIDOGREL BISULFATE 75 MG PO TABS: 75.0000 mg | ORAL_TABLET | Freq: Every day | ORAL | 11 refills | Status: DC

## 2021-07-24 MED ORDER — ASPIRIN EC 81 MG PO TBEC
DELAYED_RELEASE_TABLET | ORAL | Status: AC
  Filled 2021-07-24: qty 1

## 2021-07-24 MED ORDER — CLOPIDOGREL BISULFATE 75 MG PO TABS: 75.0000 mg | ORAL_TABLET | Freq: Every day | ORAL | Status: DC

## 2021-07-24 MED ORDER — DIPHENHYDRAMINE HCL 50 MG/ML IJ SOLN: 50.0000 mg | Freq: Once | INTRAMUSCULAR | Status: DC | PRN

## 2021-07-24 MED ORDER — FENTANYL CITRATE PF 50 MCG/ML IJ SOSY
PREFILLED_SYRINGE | INTRAMUSCULAR | Status: AC
  Filled 2021-07-24: qty 2

## 2021-07-24 MED ORDER — MIDAZOLAM HCL 2 MG/2ML IJ SOLN
INTRAMUSCULAR | Status: DC | PRN
  Administered 2021-07-24: 1 mg via INTRAVENOUS
  Administered 2021-07-24: 2 mg via INTRAVENOUS

## 2021-07-24 MED ORDER — METHYLPREDNISOLONE SODIUM SUCC 125 MG IJ SOLR: 125.0000 mg | Freq: Once | INTRAMUSCULAR | Status: DC | PRN

## 2021-07-24 MED ORDER — LABETALOL HCL 5 MG/ML IV SOLN: 10.0000 mg | INTRAVENOUS | Status: DC | PRN

## 2021-07-24 MED ORDER — SODIUM CHLORIDE 0.9% FLUSH: 3.0000 mL | Freq: Two times a day (BID) | INTRAVENOUS | Status: DC

## 2021-07-24 MED ORDER — FAMOTIDINE 20 MG PO TABS
40.0000 mg | ORAL_TABLET | Freq: Once | ORAL | Status: DC | PRN
Start: 2021-07-24 — End: 2021-07-24

## 2021-07-24 MED ORDER — ASPIRIN EC 81 MG PO TBEC
81.0000 mg | DELAYED_RELEASE_TABLET | Freq: Every day | ORAL | Status: DC
  Administered 2021-07-24: 81 mg via ORAL

## 2021-07-24 MED ORDER — ACETAMINOPHEN 325 MG PO TABS: 650.0000 mg | ORAL_TABLET | ORAL | Status: DC | PRN

## 2021-07-24 MED ORDER — MIDAZOLAM HCL 5 MG/5ML IJ SOLN
INTRAMUSCULAR | Status: AC
  Filled 2021-07-24: qty 5

## 2021-07-24 MED ORDER — HYDROMORPHONE HCL 1 MG/ML IJ SOLN: 1.0000 mg | Freq: Once | INTRAMUSCULAR | Status: DC | PRN

## 2021-07-24 SURGICAL SUPPLY — 24 items
BALLN LUTONIX 018 5X300X130 (BALLOONS) ×4
BALLN LUTONIX DCB 7X60X130 (BALLOONS) ×4
BALLN ULTRVRSE 3X100X150 (BALLOONS) ×2
BALLN ULTRVRSE 8X60X75C (BALLOONS) ×2
BALLOON LUTONIX 018 5X300X130 (BALLOONS) IMPLANT
BALLOON LUTONIX DCB 7X60X130 (BALLOONS) IMPLANT
BALLOON ULTRVRSE 3X100X150 (BALLOONS) IMPLANT
BALLOON ULTRVRSE 8X60X75C (BALLOONS) IMPLANT
CATH ANGIO 5F PIGTAIL 65CM (CATHETERS) ×1 IMPLANT
CATH BEACON 5 .038 100 VERT TP (CATHETERS) ×1 IMPLANT
CATH ROTAREX 135 6FR (CATHETERS) ×1 IMPLANT
COVER PROBE U/S 5X48 (MISCELLANEOUS) ×1 IMPLANT
DEVICE STARCLOSE SE CLOSURE (Vascular Products) ×1 IMPLANT
GLIDEWIRE ADV .035X260CM (WIRE) ×1 IMPLANT
GUIDEWIRE PFTE-COATED .018X300 (WIRE) ×1 IMPLANT
KIT ENCORE 26 ADVANTAGE (KITS) ×1 IMPLANT
PACK ANGIOGRAPHY (CUSTOM PROCEDURE TRAY) ×2 IMPLANT
SHEATH ANL2 6FRX45 HC (SHEATH) ×1 IMPLANT
SHEATH BRITE TIP 5FRX11 (SHEATH) ×1 IMPLANT
STENT LIFESTAR 10X60 (Permanent Stent) ×1 IMPLANT
STENT VIABAHN 6X250X120 (Permanent Stent) ×1 IMPLANT
SYR MEDRAD MARK 7 150ML (SYRINGE) ×1 IMPLANT
TUBING CONTRAST HIGH PRESS 72 (TUBING) ×1 IMPLANT
WIRE GUIDERIGHT .035X150 (WIRE) ×1 IMPLANT

## 2021-07-24 NOTE — Interval H&P Note (Signed)
History and Physical Interval Note: ? ?07/24/2021 ?9:02 AM ? ?Howard Stafford  has presented today for surgery, with the diagnosis of RLE Angio   BARD   ASO w claudication.  The various methods of treatment have been discussed with the patient and family. After consideration of risks, benefits and other options for treatment, the patient has consented to  Procedure(s): ?Lower Extremity Angiography (Right) as a surgical intervention.  The patient's history has been reviewed, patient examined, no change in status, stable for surgery.  I have reviewed the patient's chart and labs.  Questions were answered to the patient's satisfaction.   ? ? ?[name] ? ? ?

## 2021-07-24 NOTE — Op Note (Signed)
[facility] ? Percutaneous Study/Intervention Procedural Note ? ? ?Date of Surgery: 07/24/2021 ? ?Surgeon(s):Huxley Shurley   ? ?Assistants:none ? ?Pre-operative Diagnosis: PAD with disabling claudication right greater than left and early rest pain ? ?Post-operative diagnosis:  Same ? ?Procedure(s) Performed: ?            1.  Ultrasound guidance for vascular access left femoral artery ?            2.  Catheter placement into right common femoral artery from left femoral approach ?            3.  Aortogram and selective right lower extremity angiogram ?            4.  Percutaneous transluminal angioplasty of left external iliac artery with 7 mm diameter by 6 cm length Lutonix drug-coated angioplasty balloon ?            5.  Percutaneous transluminal angioplasty of right posterior tibial artery with 3 mm diameter by 10 cm length angioplasty balloon ? 6.  Mechanical thrombectomy of the right SFA with the [place] Rex device ?            7.  Percutaneous transluminal angioplasty of the right SFA with 5 mm diameter by 30 cm length Lutonix drug-coated angioplasty balloon ? 8.  Viabahn stent placement to the right SFA with 6 mm diameter by 25 cm length stent ? 9.  Percutaneous transluminal angioplasty of the right external iliac artery with 7 mm diameter by 6 cm length Lutonix drug-coated angioplasty balloon ? 10.  Stent placement to the right external iliac artery for greater than 50% residual stenosis after angioplasty with 10 mm diameter by 6 cm length life star stent ? 11.  StarClose closure device left femoral artery ? ?EBL: 30 cc ? ?Contrast: 80 cc ? ?Fluoro Time: 7.4 minutes ? ?Moderate Conscious Sedation Time: approximately 43 minutes using 3 mg of Versed and 100 mcg of Fentanyl ?             ?Indications:  Patient is a 76 y.o.male with disabling claudication symptoms and early rest pain worse on the right than the left. The patient has noninvasive study showing reduced perfusion worse on the right  than the left. The patient is brought in for angiography for further evaluation and potential treatment.  Risks and benefits are discussed and informed consent is obtained.  ? ?Procedure:  The patient was identified and appropriate procedural time out was performed.  The patient was then placed supine on the table and prepped and draped in the usual sterile fashion. Moderate conscious sedation was administered during a face to face encounter with the patient throughout the procedure with my supervision of the RN administering medicines and monitoring the patient's vital signs, pulse oximetry, telemetry and mental status throughout from the start of the procedure until the patient was taken to the recovery room. Ultrasound was used to evaluate the left common femoral artery.  It was patent .  A digital ultrasound image was acquired.  A Seldinger needle was used to access the left common femoral artery under direct ultrasound guidance and a permanent image was performed.  A 0.035 J wire was advanced without resistance and a 5Fr sheath was placed.  Pigtail catheter was placed into the aorta and an AP aortogram was performed. The renal arteries appeared fairly normal.  The aorta was mildly irregular.  The left external iliac artery had about a 60% stenosis in the  left common iliac artery is widely patent.  The left hypogastric artery was small.  The right common iliac artery was ectatic but not stenotic and the right internal iliac artery had reasonable flow.  The right external iliac artery had a fairly high-grade stenosis of about 75 to 80%. I then crossed the aortic bifurcation and advanced to the right femoral head. Selective right lower extremity angiogram was then performed. This demonstrated a long segment right SFA occlusion starting in the proximal segment continuing down near Hunter's canal.  The popliteal artery was fairly normal after reconstitution.  There was a high takeoff of the anterior tibial artery  which was patent into the foot.  The posterior tibial artery was the dominant runoff to the foot but there was a relatively high-grade stenosis in the proximal segment in the 70 to 80% range.  The peroneal artery was small but did provide an additional runoff vessel without obvious focal stenosis. It was felt that it was in the patient's best interest to proceed with intervention after these images to avoid a second procedure and a larger amount of contrast and fluoroscopy based off of the findings from the initial angiogram. The patient was systemically heparinized and I initially performed balloon angioplasty of the left external iliac artery with a 7 mm diameter by 6 cm length Lutonix drug-coated angioplasty balloon inflated to 12 atm for 1 minute.  Completion imaging showed about a 30 to 40% residual stenosis that did not appear flow-limiting.  I then turned my attention to the right leg.  A 6 French Ansell sheath was then placed over the [employer] wire. I then used a Kumpe catheter and the advantage wire to navigate through the right SFA occlusion without difficulty and confirm intraluminal flow in the popliteal artery.  I then exchanged for a 0.018 advantage wire and cross the stenosis in the posterior tibial artery without difficulty.  Angioplasty was then performed of the posterior tibial artery with a 3 mm diameter by 10 cm length angioplasty balloon inflated to 8 atm for 1 minute.  Completion imaging showed only about a 10 to 15% residual stenosis in the posterior tibial artery.  I then turned my attention to the SFA.  2 passes of mechanical thrombectomy were performed with the [place] Rex device in the right SFA and most proximal popliteal artery.  This helped debulk the chronic thrombus from the occlusion and created a channel of flow although a long segment of relatively high-grade residual stenosis of greater than 80% was identified.  A 5 mm diameter by 30 cm length Lutonix drug-coated angioplasty  balloon was inflated in the right SFA up to 10 atm for 1 minute.  Completion imaging showed multiple areas of greater than 50% residual stenosis so I elected to stent this area.  A 6 mm diameter by 25 cm length Viabahn stent was deployed in the right SFA and postdilated with a 5 mm balloon with excellent angiographic completion result and less than 10% residual stenosis.  I then turned my attention to the right external iliac lesion.  A 7 mm diameter by 6 cm length Lutonix drug-coated angioplasty balloon was inflated to 12 atm for 1 minute, but a greater than 50% residual stenosis was identified after angioplasty.  A 10 mm diameter by 6 cm length life star stent was then deployed in the right external iliac artery postdilated with an 8 mm Lutonix drug-coated balloon with excellent angiographic completion result and only about 10% residual stenosis. I elected  to terminate the procedure. The sheath was removed and StarClose closure device was deployed in the left femoral artery with excellent hemostatic result. The patient was taken to the recovery room in stable condition having tolerated the procedure well. ? ?Findings:   ?            Aortogram: The renal arteries appeared fairly normal.  The aorta was mildly irregular.  The left external iliac artery had about a 60% stenosis in the left common iliac artery is widely patent.  The left hypogastric artery was small.  The right common iliac artery was ectatic but not stenotic and the right internal iliac artery had reasonable flow.  The right external iliac artery had a fairly high-grade stenosis of about 75 to 80%. ?            Right lower Extremity:  This demonstrated a long segment right SFA occlusion starting in the proximal segment continuing down near Hunter's canal.  The popliteal artery was fairly normal after reconstitution.  There was a high takeoff of the anterior tibial artery which was patent into the foot.  The posterior tibial artery was the dominant  runoff to the foot but there was a relatively high-grade stenosis in the proximal segment in the 70 to 80% range.  The peroneal artery was small but did provide an additional runoff vessel without obvious f

## 2021-07-25 ENCOUNTER — Other Ambulatory Visit (INDEPENDENT_AMBULATORY_CARE_PROVIDER_SITE_OTHER): Payer: Self-pay

## 2021-07-25 ENCOUNTER — Encounter: Payer: Self-pay | Admitting: Vascular Surgery

## 2021-07-25 MED ORDER — ASPIRIN EC 81 MG PO TBEC: 81.0000 mg | DELAYED_RELEASE_TABLET | Freq: Every day | ORAL | 2 refills | Status: AC

## 2021-07-25 MED ORDER — ATORVASTATIN CALCIUM 10 MG PO TABS: 10.0000 mg | ORAL_TABLET | Freq: Every day | ORAL | 11 refills | Status: DC

## 2021-07-25 MED ORDER — CLOPIDOGREL BISULFATE 75 MG PO TABS: 75.0000 mg | ORAL_TABLET | Freq: Every day | ORAL | 11 refills | Status: DC

## 2021-07-26 ENCOUNTER — Telehealth: Payer: Self-pay

## 2021-07-26 NOTE — Telephone Encounter (Signed)
Spoke with patients wife susan, she would like me to fax her the forms tomorrow at [phone] between 10am to 5pm. ?

## 2021-07-27 NOTE — Telephone Encounter (Signed)
error 

## 2021-07-27 NOTE — Telephone Encounter (Signed)
Forms were faxed to [facility]. ?

## 2021-07-28 NOTE — Telephone Encounter (Signed)
No PA required for Eligard, Medicare primary.  ?

## 2021-07-31 ENCOUNTER — Encounter: Payer: Self-pay | Admitting: Vascular Surgery

## 2021-07-31 ENCOUNTER — Telehealth: Payer: Self-pay

## 2021-07-31 NOTE — Telephone Encounter (Signed)
Spoke with wife, she will bring forms on Wednesday at patients appointment ?

## 2021-07-31 NOTE — Telephone Encounter (Signed)
Patient recently had STENTS placed in legs. Pt needs clearance from Dr.Dew to hold ASA and coumadin. Clearance faxed and message left for Dr.Dew nurse to call back. ?

## 2021-08-01 NOTE — Progress Notes (Signed)
? ?08/03/21 ?8:18 AM  ? ?Howard Stafford ?November 29, 1945 ?[id number] ? ?Referring provider:  ?Albina Billet, MD ?[address]   ?[place],  [place] [address] ?No chief complaint on file. ? ? ? ? ?HPI: ?Howard Stafford is a 76 y.o.male with a personal history of metastatic castrate sensitive prostate cancer, who presents today for Eligard injection and Gold seed implant.  ? ?PSA on 06/02/2021 was 92.1. ? ?He underwent a prostate biopsy on 06/14/2021. Prostate biopsy was complicated by vasovagal episode he was admitted into the hospital for neurological and cardiovascular work-up which was all negative.  ?  ?Surgical pathology was consistent with Gleason 4+3 involving three cores affecting up to 91%, Gleason 3+4 involving 6 cores affecting up to 100%, Gleason 3+3 involving 3 cores affecting up to 25%.  ? ?PSMA PET on 06/28/2021 scan visualized Intense radiotracer activity within LEFT RIGHT lobe prostate gland consistent with high-grade prostate adenocarcinoma. Multiple enlarged intensely radiotracer avid lymph nodes primarily in medial rectum of the pelvis. Single retroperitoneal periaortic metastatic node just above the bifurcation. This is the most superior metastatic node in the abdomen pelvis. No evidence of visceral metastasis.Solitary intensely radiotracer avid bone lesion in the T7 vertebral body. Differential would include benign activity associated with a Schmorl's node versus solitary skeletal metastasis. CT findings favor Schmorl's nodes  ? ?He has since started ADT in the form of loading dose of firmagon last month and doing well on this.  He has been seen and evaluated both by radiation oncology as well as medical oncology.  They are planning to treat with whole pelvic radiation up to his aortic bifurcation.  No indication for chemotherapy at this time, will reserve for progression per Dr. Grayland Ormond.  He is in the process of applying for patient assistance for new backup. ? ?He has now undergone right lower  extremity angiography and stenting for peripheral artery disease.  He is on plavix and aspirin.  Initial plans for placement of fiducial markers however in light of continues need for Plavix, elected to defer this today after further discussion with Dr. Baruch Gouty. ? ?PMH: ?Past Medical History:  ?Diagnosis Date  ? Prostate cancer ([place])   ? Skin cancer face  ? Basal cell carcinoma of nose  ? ? ?Surgical History: ?Past Surgical History:  ?Procedure Laterality Date  ? BACK SURGERY    ? 04/30/1984 - 04/29/1985  ? EYE SURGERY Left 04/08/2020  ? TRANSFER ADJACENT TISSUE EYELID, NOSE, EAR AND/OR LIP  ? LOWER EXTREMITY ANGIOGRAPHY Right 07/24/2021  ? Procedure: Lower Extremity Angiography;  Surgeon: Algernon Huxley, MD;  Location: [facility];  Service: Cardiovascular;  Laterality: Right;  ? MOHS SURGERY    ? 04/30/2009 - 04/29/2010  ? skin cancer removal  2015  ? 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 ? ?Home Medications:  ?Allergies as of 08/02/2021   ?No Known Allergies ?  ? ?  ?Medication List  ?  ? ?  ? Accurate as of August 02, 2021 11:59 PM. If you have any questions, ask your nurse or doctor.  ?  ?  ? ?  ? ?aspirin EC 81 MG tablet ?Take 1 tablet (81 mg total) by mouth daily. ?  ?atorvastatin 10 MG tablet ?Commonly known as: Lipitor ?Take 1 tablet (10 mg total) by mouth daily. ?What changed: Another medication with the same name was removed. Continue taking this medication, and follow the directions you see here. ?Changed by: Hollice Espy, MD ?  ?clopidogrel 75  MG tablet ?Commonly known as: Plavix ?Take 1 tablet (75 mg total) by mouth daily. ?  ?lisinopril 10 MG tablet ?Commonly known as: ZESTRIL ?Take 10 mg by mouth daily. ?  ?Nubeqa 300 MG tablet ?Generic drug: darolutamide ?Take 2 tablets (600 mg total) by mouth 2 (two) times daily with a meal. ?  ? ?  ? ? ?Allergies:  ?No Known Allergies ? ?Family History: ?No family history on file. ? ?Social History:  reports that he has been smoking cigarettes. He has a 50.00 pack-year  smoking history. He has been exposed to tobacco smoke. He has never used smokeless tobacco. He reports current alcohol use. He reports that he does not use drugs. ? ? ?Physical Exam: ?BP 125/65   Pulse 88   Ht '5\' 10"'$  (1.778 m)   Wt 195 lb (88.5 kg)   BMI 27.98 kg/m?   ?Constitutional:  Alert and oriented, No acute distress.  Accompanied by wife today.   ?HEENT: [place] AT, moist mucus membranes.  Trachea midline, no masses. ?Cardiovascular: No clubbing, cyanosis, or edema. ?Respiratory: Normal respiratory effort, no increased work of breathing. ?Skin: No rashes, bruises or suspicious lesions. ?Neurologic: Grossly intact, no focal deficits, moving all 4 extremities. ?Psychiatric: Normal mood and affect. ? ?Laboratory Data: ? ?Lab Results  ?Component Value Date  ? CREATININE 0.96 07/24/2021  ? ?Lab Results  ?Component Value Date  ? HGBA1C 5.3 06/14/2021  ? ? ?Assessment & Plan:  ?  ?Prostate cancer, metastatic castrate sensitive ?- High volume high risk unfavorable  ?- He was given his first Eligard injection q6 month depo after initial firmagon loading ?- Continue to work to get assistance for [employer] ?-schedule for radiation planning next week ?- Discussed the side effects of ADT again today  ? ?Return in about 3 months (around 11/01/2021). PSA ? ?[name] as a Education administrator for Hollice Espy, MD.,have documented all relevant documentation on the behalf of Hollice Espy, MD,as directed by  Hollice Espy, MD while in the presence of Hollice Espy, MD. ? ?I have reviewed the above documentation for accuracy and completeness, and I agree with the above.  ? ?Hollice Espy, MD ? ?[facility] ?[address], [address] ?[place], [place] [address] ?([phone] ? ?

## 2021-08-02 ENCOUNTER — Encounter: Payer: Self-pay | Admitting: Urology

## 2021-08-02 ENCOUNTER — Ambulatory Visit (INDEPENDENT_AMBULATORY_CARE_PROVIDER_SITE_OTHER): Payer: Medicare Other | Admitting: Urology

## 2021-08-02 VITALS — BP 125/65 | HR 88 | Ht 70.0 in | Wt 195.0 lb

## 2021-08-02 DIAGNOSIS — C61 Malignant neoplasm of prostate: Secondary | ICD-10-CM

## 2021-08-02 DIAGNOSIS — I70211 Atherosclerosis of native arteries of extremities with intermittent claudication, right leg: Secondary | ICD-10-CM | POA: Diagnosis not present

## 2021-08-02 MED ORDER — LEUPROLIDE ACETATE (6 MONTH) 45 MG ~~LOC~~ KIT
45.0000 mg | PACK | Freq: Once | SUBCUTANEOUS | Status: AC
  Administered 2021-08-02: 45 mg via SUBCUTANEOUS

## 2021-08-02 NOTE — Progress Notes (Signed)
Eligard SubQ Injection  ? ?Due to Prostate Cancer patient is present today for a Eligard Injection. ? ?Medication: Eligard 6 month ?Dose: 45 mg  ?Location: left buttock subcutaneous ?Lot: [id number] ?Exp: 03/28/23 ? ?Patient tolerated well, no complications were noted ? ?Performed by: Verlene Mayer, CMA ? ?Per Dr. Erlene Quan patient is to continue therapy for 2 years . Patient's next follow up was scheduled for 11/01/21. This appointment was scheduled using wheel and given to patient today along with reminder continue on Vitamin D 800-1000iu and Calcium 1000-'1200mg'$  daily while on Androgen Deprivation Therapy.  ? ?PA approval dates: No PA Required ? ?

## 2021-08-03 NOTE — Telephone Encounter (Signed)
Forms faxed to [facility] ?

## 2021-08-08 ENCOUNTER — Ambulatory Visit
Admission: RE | Admit: 2021-08-08 | Discharge: 2021-08-08 | Disposition: A | Discharge status: Home / Self Care | Payer: Medicare Other | Source: Ambulatory Visit | Attending: Radiation Oncology | Admitting: Radiation Oncology

## 2021-08-08 ENCOUNTER — Other Ambulatory Visit: Payer: Medicare Other | Admitting: Urology

## 2021-08-08 DIAGNOSIS — R972 Elevated prostate specific antigen [PSA]: Secondary | ICD-10-CM | POA: Diagnosis not present

## 2021-08-08 DIAGNOSIS — Z510 Encounter for antineoplastic radiation therapy: Secondary | ICD-10-CM | POA: Insufficient documentation

## 2021-08-08 DIAGNOSIS — C61 Malignant neoplasm of prostate: Secondary | ICD-10-CM | POA: Diagnosis not present

## 2021-08-08 NOTE — Telephone Encounter (Signed)
Received fax from [place] medication covered. Phone number given to pt wife. [phone] ([phone]) ?

## 2021-08-09 ENCOUNTER — Other Ambulatory Visit: Payer: Medicare Other

## 2021-08-09 ENCOUNTER — Ambulatory Visit: Payer: Medicare Other

## 2021-08-09 ENCOUNTER — Ambulatory Visit: Payer: Medicare Other | Admitting: Oncology

## 2021-08-11 ENCOUNTER — Other Ambulatory Visit: Payer: Self-pay | Admitting: *Deleted

## 2021-08-11 ENCOUNTER — Telehealth: Payer: Self-pay | Admitting: Urology

## 2021-08-11 DIAGNOSIS — C61 Malignant neoplasm of prostate: Secondary | ICD-10-CM

## 2021-08-11 NOTE — Telephone Encounter (Signed)
Patients wife called and wanted you to know that he git the medication Nubeqa and she has many questions for you. She would like for you to call her back when you return to the clinic on Monday. ?[phone] ?

## 2021-08-14 DIAGNOSIS — C61 Malignant neoplasm of prostate: Secondary | ICD-10-CM | POA: Diagnosis not present

## 2021-08-14 DIAGNOSIS — Z510 Encounter for antineoplastic radiation therapy: Secondary | ICD-10-CM | POA: Diagnosis not present

## 2021-08-14 DIAGNOSIS — R972 Elevated prostate specific antigen [PSA]: Secondary | ICD-10-CM | POA: Diagnosis not present

## 2021-08-14 DIAGNOSIS — I10 Essential (primary) hypertension: Secondary | ICD-10-CM | POA: Diagnosis not present

## 2021-08-14 NOTE — Telephone Encounter (Signed)
Pt wife states pt started taking Nubeqa. She asked if it would interfere with patients eye injections. I advised pt wife to reach out to patient ophthalmologist has I am unsure of what type of injections. Pt wife verbalized understanding.  ?

## 2021-08-16 ENCOUNTER — Ambulatory Visit: Admission: RE | Admit: 2021-08-16 | Payer: Medicare Other | Source: Ambulatory Visit

## 2021-08-17 ENCOUNTER — Ambulatory Visit
Admission: RE | Admit: 2021-08-17 | Discharge: 2021-08-17 | Disposition: A | Discharge status: Home / Self Care | Payer: Medicare Other | Source: Ambulatory Visit | Attending: Radiation Oncology | Admitting: Radiation Oncology

## 2021-08-17 ENCOUNTER — Other Ambulatory Visit: Payer: Self-pay

## 2021-08-17 DIAGNOSIS — R972 Elevated prostate specific antigen [PSA]: Secondary | ICD-10-CM | POA: Diagnosis not present

## 2021-08-17 DIAGNOSIS — Z510 Encounter for antineoplastic radiation therapy: Secondary | ICD-10-CM | POA: Diagnosis not present

## 2021-08-17 DIAGNOSIS — C61 Malignant neoplasm of prostate: Secondary | ICD-10-CM | POA: Diagnosis not present

## 2021-08-17 LAB — RAD ONC ARIA SESSION SUMMARY
Course Elapsed Days: 0
Plan Fractions Treated to Date: 1
Plan Prescribed Dose Per Fraction: 2 Gy
Plan Total Fractions Prescribed: 40
Plan Total Prescribed Dose: 80 Gy
Reference Point Dosage Given to Date: 2 Gy
Reference Point Session Dosage Given: 2 Gy
Session Number: 1

## 2021-08-18 ENCOUNTER — Other Ambulatory Visit: Payer: Self-pay

## 2021-08-18 ENCOUNTER — Ambulatory Visit
Admission: RE | Admit: 2021-08-18 | Discharge: 2021-08-18 | Disposition: A | Discharge status: Home / Self Care | Payer: Medicare Other | Source: Ambulatory Visit | Attending: Radiation Oncology | Admitting: Radiation Oncology

## 2021-08-18 DIAGNOSIS — Z510 Encounter for antineoplastic radiation therapy: Secondary | ICD-10-CM | POA: Diagnosis not present

## 2021-08-18 DIAGNOSIS — R972 Elevated prostate specific antigen [PSA]: Secondary | ICD-10-CM | POA: Diagnosis not present

## 2021-08-18 DIAGNOSIS — C61 Malignant neoplasm of prostate: Secondary | ICD-10-CM | POA: Diagnosis not present

## 2021-08-18 LAB — RAD ONC ARIA SESSION SUMMARY
Course Elapsed Days: 1
Plan Fractions Treated to Date: 2
Plan Prescribed Dose Per Fraction: 2 Gy
Plan Total Fractions Prescribed: 40
Plan Total Prescribed Dose: 80 Gy
Reference Point Dosage Given to Date: 4 Gy
Reference Point Session Dosage Given: 2 Gy
Session Number: 2

## 2021-08-21 ENCOUNTER — Other Ambulatory Visit (INDEPENDENT_AMBULATORY_CARE_PROVIDER_SITE_OTHER): Payer: Self-pay | Admitting: Vascular Surgery

## 2021-08-21 ENCOUNTER — Ambulatory Visit
Admission: RE | Admit: 2021-08-21 | Discharge: 2021-08-21 | Disposition: A | Discharge status: Home / Self Care | Payer: Medicare Other | Source: Ambulatory Visit | Attending: Radiation Oncology | Admitting: Radiation Oncology

## 2021-08-21 ENCOUNTER — Other Ambulatory Visit: Payer: Self-pay

## 2021-08-21 DIAGNOSIS — I70221 Atherosclerosis of native arteries of extremities with rest pain, right leg: Secondary | ICD-10-CM

## 2021-08-21 DIAGNOSIS — Z510 Encounter for antineoplastic radiation therapy: Secondary | ICD-10-CM | POA: Diagnosis not present

## 2021-08-21 DIAGNOSIS — C61 Malignant neoplasm of prostate: Secondary | ICD-10-CM | POA: Diagnosis not present

## 2021-08-21 DIAGNOSIS — Z95820 Peripheral vascular angioplasty status with implants and grafts: Secondary | ICD-10-CM

## 2021-08-21 DIAGNOSIS — R972 Elevated prostate specific antigen [PSA]: Secondary | ICD-10-CM | POA: Diagnosis not present

## 2021-08-21 LAB — RAD ONC ARIA SESSION SUMMARY
Course Elapsed Days: 4
Plan Fractions Treated to Date: 3
Plan Prescribed Dose Per Fraction: 2 Gy
Plan Total Fractions Prescribed: 40
Plan Total Prescribed Dose: 80 Gy
Reference Point Dosage Given to Date: 6 Gy
Reference Point Session Dosage Given: 2 Gy
Session Number: 3

## 2021-08-22 ENCOUNTER — Other Ambulatory Visit: Payer: Self-pay

## 2021-08-22 ENCOUNTER — Ambulatory Visit
Admission: RE | Admit: 2021-08-22 | Discharge: 2021-08-22 | Disposition: A | Discharge status: Home / Self Care | Payer: Medicare Other | Source: Ambulatory Visit | Attending: Radiation Oncology | Admitting: Radiation Oncology

## 2021-08-22 DIAGNOSIS — R972 Elevated prostate specific antigen [PSA]: Secondary | ICD-10-CM | POA: Diagnosis not present

## 2021-08-22 DIAGNOSIS — C61 Malignant neoplasm of prostate: Secondary | ICD-10-CM | POA: Diagnosis not present

## 2021-08-22 DIAGNOSIS — H34812 Central retinal vein occlusion, left eye, with macular edema: Secondary | ICD-10-CM | POA: Diagnosis not present

## 2021-08-22 DIAGNOSIS — Z510 Encounter for antineoplastic radiation therapy: Secondary | ICD-10-CM | POA: Diagnosis not present

## 2021-08-22 LAB — RAD ONC ARIA SESSION SUMMARY
Course Elapsed Days: 5
Plan Fractions Treated to Date: 4
Plan Prescribed Dose Per Fraction: 2 Gy
Plan Total Fractions Prescribed: 40
Plan Total Prescribed Dose: 80 Gy
Reference Point Dosage Given to Date: 8 Gy
Reference Point Session Dosage Given: 2 Gy
Session Number: 4

## 2021-08-23 ENCOUNTER — Telehealth: Payer: Self-pay

## 2021-08-23 ENCOUNTER — Other Ambulatory Visit: Payer: Self-pay

## 2021-08-23 ENCOUNTER — Encounter (INDEPENDENT_AMBULATORY_CARE_PROVIDER_SITE_OTHER): Payer: Self-pay | Admitting: Nurse Practitioner

## 2021-08-23 ENCOUNTER — Ambulatory Visit (INDEPENDENT_AMBULATORY_CARE_PROVIDER_SITE_OTHER): Payer: Medicare Other | Admitting: Nurse Practitioner

## 2021-08-23 ENCOUNTER — Ambulatory Visit (INDEPENDENT_AMBULATORY_CARE_PROVIDER_SITE_OTHER): Payer: Medicare Other

## 2021-08-23 ENCOUNTER — Ambulatory Visit
Admission: RE | Admit: 2021-08-23 | Discharge: 2021-08-23 | Disposition: A | Discharge status: Home / Self Care | Payer: Medicare Other | Source: Ambulatory Visit | Attending: Radiation Oncology | Admitting: Radiation Oncology

## 2021-08-23 VITALS — BP 121/67 | HR 78 | Resp 17 | Ht 70.0 in | Wt 186.0 lb

## 2021-08-23 DIAGNOSIS — I70221 Atherosclerosis of native arteries of extremities with rest pain, right leg: Secondary | ICD-10-CM

## 2021-08-23 DIAGNOSIS — Z95820 Peripheral vascular angioplasty status with implants and grafts: Secondary | ICD-10-CM

## 2021-08-23 DIAGNOSIS — Z510 Encounter for antineoplastic radiation therapy: Secondary | ICD-10-CM | POA: Diagnosis not present

## 2021-08-23 DIAGNOSIS — R972 Elevated prostate specific antigen [PSA]: Secondary | ICD-10-CM | POA: Diagnosis not present

## 2021-08-23 DIAGNOSIS — Z720 Tobacco use: Secondary | ICD-10-CM

## 2021-08-23 DIAGNOSIS — C61 Malignant neoplasm of prostate: Secondary | ICD-10-CM | POA: Diagnosis not present

## 2021-08-23 LAB — RAD ONC ARIA SESSION SUMMARY
Course Elapsed Days: 6
Plan Fractions Treated to Date: 5
Plan Prescribed Dose Per Fraction: 2 Gy
Plan Total Fractions Prescribed: 40
Plan Total Prescribed Dose: 80 Gy
Reference Point Dosage Given to Date: 10 Gy
Reference Point Session Dosage Given: 2 Gy
Session Number: 5

## 2021-08-23 NOTE — Telephone Encounter (Addendum)
error 

## 2021-08-24 ENCOUNTER — Other Ambulatory Visit: Payer: Self-pay

## 2021-08-24 ENCOUNTER — Ambulatory Visit
Admission: RE | Admit: 2021-08-24 | Discharge: 2021-08-24 | Disposition: A | Discharge status: Home / Self Care | Payer: Medicare Other | Source: Ambulatory Visit | Attending: Radiation Oncology | Admitting: Radiation Oncology

## 2021-08-24 DIAGNOSIS — Z510 Encounter for antineoplastic radiation therapy: Secondary | ICD-10-CM | POA: Diagnosis not present

## 2021-08-24 DIAGNOSIS — C61 Malignant neoplasm of prostate: Secondary | ICD-10-CM | POA: Diagnosis not present

## 2021-08-24 DIAGNOSIS — R972 Elevated prostate specific antigen [PSA]: Secondary | ICD-10-CM | POA: Diagnosis not present

## 2021-08-24 LAB — RAD ONC ARIA SESSION SUMMARY
Course Elapsed Days: 7
Plan Fractions Treated to Date: 6
Plan Prescribed Dose Per Fraction: 2 Gy
Plan Total Fractions Prescribed: 40
Plan Total Prescribed Dose: 80 Gy
Reference Point Dosage Given to Date: 12 Gy
Reference Point Session Dosage Given: 2 Gy
Session Number: 6

## 2021-08-25 ENCOUNTER — Other Ambulatory Visit: Payer: Self-pay

## 2021-08-25 ENCOUNTER — Ambulatory Visit
Admission: RE | Admit: 2021-08-25 | Discharge: 2021-08-25 | Disposition: A | Discharge status: Home / Self Care | Payer: Medicare Other | Source: Ambulatory Visit | Attending: Radiation Oncology | Admitting: Radiation Oncology

## 2021-08-25 DIAGNOSIS — Z510 Encounter for antineoplastic radiation therapy: Secondary | ICD-10-CM | POA: Diagnosis not present

## 2021-08-25 DIAGNOSIS — R972 Elevated prostate specific antigen [PSA]: Secondary | ICD-10-CM | POA: Diagnosis not present

## 2021-08-25 DIAGNOSIS — C61 Malignant neoplasm of prostate: Secondary | ICD-10-CM | POA: Diagnosis not present

## 2021-08-25 LAB — RAD ONC ARIA SESSION SUMMARY
Course Elapsed Days: 8
Plan Fractions Treated to Date: 7
Plan Prescribed Dose Per Fraction: 2 Gy
Plan Total Fractions Prescribed: 40
Plan Total Prescribed Dose: 80 Gy
Reference Point Dosage Given to Date: 14 Gy
Reference Point Session Dosage Given: 2 Gy
Session Number: 7

## 2021-08-26 ENCOUNTER — Encounter (INDEPENDENT_AMBULATORY_CARE_PROVIDER_SITE_OTHER): Payer: Self-pay | Admitting: Nurse Practitioner

## 2021-08-26 NOTE — Progress Notes (Signed)
? ?Subjective:  ? ? Patient ID: Howard Stafford, male    DOB: February 10, 1946, 76 y.o.   MRN: [id number] ?No chief complaint on file. ? ? ?The patient returns to the office for followup and review status post angiogram with intervention on 07/24/2021.  ? ?Procedure: ?Procedure(s) Performed: ?            1.  Ultrasound guidance for vascular access left femoral artery ?            2.  Catheter placement into right common femoral artery from left femoral approach ?            3.  Aortogram and selective right lower extremity angiogram ?            4.  Percutaneous transluminal angioplasty of left external iliac artery with 7 mm diameter by 6 cm length Lutonix drug-coated angioplasty balloon ?            5.  Percutaneous transluminal angioplasty of right posterior tibial artery with 3 mm diameter by 10 cm length angioplasty balloon ?            6.  Mechanical thrombectomy of the right SFA with the [place] Rex device ?            7.  Percutaneous transluminal angioplasty of the right SFA with 5 mm diameter by 30 cm length Lutonix drug-coated angioplasty balloon ?            8.  Viabahn stent placement to the right SFA with 6 mm diameter by 25 cm length stent ?            9.  Percutaneous transluminal angioplasty of the right external iliac artery with 7 mm diameter by 6 cm length Lutonix drug-coated angioplasty balloon ?            10.  Stent placement to the right external iliac artery for greater than 50% residual stenosis after angioplasty with 10 mm diameter by 6 cm length life star stent ?            11.  StarClose closure device left femoral artery ? ?The patient notes improvement in the lower extremity symptoms. No interval shortening of the patient's claudication distance or rest pain symptoms. No new ulcers or wounds have occurred since the last visit. ? ?The patient was at the beginning of receiving radiation for cancer treatment ? ?No documented history of amaurosis fugax or recent TIA symptoms. There are no recent  neurological changes noted. ?No documented history of DVT, PE or superficial thrombophlebitis. ?The patient denies recent episodes of angina or shortness of breath.  ? ?ABI's Rt=1.15 and Lt=0.77  (previous ABI's Rt=0.72 and Lt=0.74) ?Duplex US of the right lower extremity shows biphasic waveforms with biphasic/monophasic waveforms in the left tibial arterie with good toe waveforms on the right and slightly dampened on the left ? ? ?Review of Systems  ?Cardiovascular:   ?     Denies claudication  ?All other systems reviewed and are negative. ? ?   ?Objective:  ? Physical Exam ?Vitals reviewed.  ?HENT:  ?   Head: Normocephalic.  ?Cardiovascular:  ?   Rate and Rhythm: Normal rate.  ?   Pulses:     ?     Radial pulses are 1+ on the right side and 1+ on the left side.  ?     Dorsalis pedis pulses are detected w/ Doppler on the right side and detected w/ Doppler  on the left side.  ?     Posterior tibial pulses are detected w/ Doppler on the right side and detected w/ Doppler on the left side.  ?Pulmonary:  ?   Effort: Pulmonary effort is normal.  ?Skin: ?   General: Skin is warm and dry.  ?Neurological:  ?   Mental Status: He is alert and oriented to person, place, and time.  ?Psychiatric:     ?   Mood and Affect: Mood normal.     ?   Behavior: Behavior normal.     ?   Thought Content: Thought content normal.     ?   Judgment: Judgment normal.  ? ? ?BP 121/67 (BP Location: Right Arm)   Pulse 78   Resp 17   Ht '5\' 10"'$  (1.778 m)   Wt 186 lb (84.4 kg)   BMI 26.69 kg/m?  ? ?Past Medical History:  ?Diagnosis Date  ? Prostate cancer ([place])   ? Skin cancer face  ? Basal cell carcinoma of nose  ? ? ?Social History  ? ?Socioeconomic History  ? Marital status: Married  ?  Spouse name: Not on file  ? Number of children: Not on file  ? Years of education: Not on file  ? Highest education level: Not on file  ?Occupational History  ? Not on file  ?Tobacco Use  ? Smoking status: Every Day  ?  Packs/day: 1.00  ?  Years: 50.00  ?  Pack  years: 50.00  ?  Types: Cigarettes  ?  Passive exposure: Current  ? Smokeless tobacco: Never  ?Substance and Sexual Activity  ? Alcohol use: Yes  ?  Comment: Everynight Barnabas Lister daniels  ? Drug use: No  ? Sexual activity: Not on file  ?Other Topics Concern  ? Not on file  ?Social History Narrative  ? ** Merged History Encounter **  ?    ? ?Social Determinants of Health  ? ?Financial Resource Strain: Not on file  ?Food Insecurity: Not on file  ?Transportation Needs: Not on file  ?Physical Activity: Not on file  ?Stress: Not on file  ?Social Connections: Not on file  ?Intimate Partner Violence: Not on file  ? ? ?Past Surgical History:  ?Procedure Laterality Date  ? BACK SURGERY    ? 04/30/1984 - 04/29/1985  ? EYE SURGERY Left 04/08/2020  ? TRANSFER ADJACENT TISSUE EYELID, NOSE, EAR AND/OR LIP  ? LOWER EXTREMITY ANGIOGRAPHY Right 07/24/2021  ? Procedure: Lower Extremity Angiography;  Surgeon: Algernon Huxley, MD;  Location: [facility];  Service: Cardiovascular;  Laterality: Right;  ? MOHS SURGERY    ? 04/30/2009 - 04/29/2010  ? skin cancer removal  2015  ? 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 ? ?History reviewed. No pertinent family history. ? ?No Known Allergies ? ? ?  Latest Ref Rng & Units 06/15/2021  ?  5:09 AM 06/14/2021  ?  3:11 PM 01/31/2013  ?  4:03 PM  ?CBC  ?WBC 4.0 - 10.5 K/uL 13.4   12.3   13.1    ?Hemoglobin 13.0 - 17.0 g/dL 15.0   16.5   15.2    ?Hematocrit 39.0 - 52.0 % 42.6   47.0   44.3    ?Platelets 150 - 400 K/uL 171   239   190    ? ? ? ? ?CMP  ?   ?Component Value Date/Time  ? NA 135 06/14/2021 1511  ? K 3.6 06/14/2021 1511  ? CL 104 06/14/2021  1511  ? CO2 22 06/14/2021 1511  ? GLUCOSE 130 (H) 06/14/2021 1511  ? BUN 17 07/24/2021 0952  ? CREATININE 0.96 07/24/2021 0952  ? CALCIUM 9.1 06/14/2021 1511  ? PROT 7.2 06/14/2021 1511  ? ALBUMIN 4.0 06/14/2021 1511  ? AST 29 06/14/2021 1511  ? ALT 32 06/14/2021 1511  ? ALKPHOS 86 06/14/2021 1511  ? BILITOT 0.8 06/14/2021 1511  ? GFRNONAA >60 07/24/2021 5852   ? ? ? ?VAS [place] ABI WITH/WO TBI ? ?Result Date: 08/25/2021 ? LOWER EXTREMITY DOPPLER STUDY Patient Name:  Howard Stafford  Date of Exam:   08/23/2021 Medical Rec #: [id number]           Accession #:    [id number] Date of Birth: Sep 16, 1945            Patient Gender: M Patient Age:   54 years Exam Location:  [facility] Procedure:      VAS [place] ABI WITH/WO TBI Referring Phys: [name] --------------------------------------------------------------------------------  Indications: Claudication, and peripheral artery disease.  Vascular Interventions: 07/24/2021: Aortagram and Selective Right Lower                         Extremity Angiogram. PTA of the Left EIA with 7 mm                         diameter by 6 cm length Lutonix drug coated angioplasty                         balloon. PTA of the Right Posterior Tibial Artery with 3                         mm diameter by 10 cm length angioplasty balloon.                         Mechanical Thrombectomy of the Right SFA with the [place]                         Rex device. PTA of the Right SFAwith 5 mm diameter by 30                         cm length drug coated drug coated angioplasty balloon.                         Viabahn Stent to the Right SFA with 6 mm diameter by 25                         cm length stent. PTA of the Right EIA Stent placement of                         the Right EIA for > 50% residual after angioplasty. Comparison Study: 07/18/2021 Performing Technologist: Almira Coaster RVS  Examination Guidelines: A complete evaluation includes at minimum, Doppler waveform signals and systolic blood pressure reading at the level of bilateral brachial, anterior tibial, and posterior tibial arteries, when vessel segments are accessible. Bilateral testing is considered an integral part of a complete examination. Photoelectric Plethysmograph (PPG) waveforms and toe systolic pressure readings are included as required and additional duplex testing as needed. Limited  examinations for reoccurring indications may be performed as noted.  ABI Findings: +---------+------------------+-----+--------+--------+ Right    Rt Pressure (mmHg)IndexWaveformComment  +---------+------------------

## 2021-08-28 ENCOUNTER — Ambulatory Visit
Admission: RE | Admit: 2021-08-28 | Discharge: 2021-08-28 | Disposition: A | Discharge status: Home / Self Care | Payer: Medicare Other | Source: Ambulatory Visit | Attending: Radiation Oncology | Admitting: Radiation Oncology

## 2021-08-28 ENCOUNTER — Other Ambulatory Visit: Payer: Self-pay

## 2021-08-28 DIAGNOSIS — C61 Malignant neoplasm of prostate: Secondary | ICD-10-CM | POA: Insufficient documentation

## 2021-08-28 DIAGNOSIS — Z510 Encounter for antineoplastic radiation therapy: Secondary | ICD-10-CM | POA: Insufficient documentation

## 2021-08-28 LAB — RAD ONC ARIA SESSION SUMMARY
Course Elapsed Days: 11
Plan Fractions Treated to Date: 8
Plan Prescribed Dose Per Fraction: 2 Gy
Plan Total Fractions Prescribed: 40
Plan Total Prescribed Dose: 80 Gy
Reference Point Dosage Given to Date: 16 Gy
Reference Point Session Dosage Given: 2 Gy
Session Number: 8

## 2021-08-29 ENCOUNTER — Other Ambulatory Visit: Payer: Self-pay

## 2021-08-29 ENCOUNTER — Ambulatory Visit
Admission: RE | Admit: 2021-08-29 | Discharge: 2021-08-29 | Disposition: A | Discharge status: Home / Self Care | Payer: Medicare Other | Source: Ambulatory Visit | Attending: Radiation Oncology | Admitting: Radiation Oncology

## 2021-08-29 DIAGNOSIS — C61 Malignant neoplasm of prostate: Secondary | ICD-10-CM | POA: Diagnosis not present

## 2021-08-29 DIAGNOSIS — Z510 Encounter for antineoplastic radiation therapy: Secondary | ICD-10-CM | POA: Diagnosis not present

## 2021-08-29 LAB — RAD ONC ARIA SESSION SUMMARY
Course Elapsed Days: 12
Plan Fractions Treated to Date: 9
Plan Prescribed Dose Per Fraction: 2 Gy
Plan Total Fractions Prescribed: 40
Plan Total Prescribed Dose: 80 Gy
Reference Point Dosage Given to Date: 18 Gy
Reference Point Session Dosage Given: 2 Gy
Session Number: 9

## 2021-08-30 ENCOUNTER — Ambulatory Visit
Admission: RE | Admit: 2021-08-30 | Discharge: 2021-08-30 | Disposition: A | Discharge status: Home / Self Care | Payer: Medicare Other | Source: Ambulatory Visit | Attending: Radiation Oncology | Admitting: Radiation Oncology

## 2021-08-30 ENCOUNTER — Other Ambulatory Visit: Payer: Self-pay

## 2021-08-30 DIAGNOSIS — C61 Malignant neoplasm of prostate: Secondary | ICD-10-CM | POA: Diagnosis not present

## 2021-08-30 DIAGNOSIS — Z510 Encounter for antineoplastic radiation therapy: Secondary | ICD-10-CM | POA: Diagnosis not present

## 2021-08-30 LAB — RAD ONC ARIA SESSION SUMMARY
Course Elapsed Days: 13
Plan Fractions Treated to Date: 10
Plan Prescribed Dose Per Fraction: 2 Gy
Plan Total Fractions Prescribed: 40
Plan Total Prescribed Dose: 80 Gy
Reference Point Dosage Given to Date: 20 Gy
Reference Point Session Dosage Given: 2 Gy
Session Number: 10

## 2021-08-31 ENCOUNTER — Ambulatory Visit
Admission: RE | Admit: 2021-08-31 | Discharge: 2021-08-31 | Disposition: A | Discharge status: Home / Self Care | Payer: Medicare Other | Source: Ambulatory Visit | Attending: Radiation Oncology | Admitting: Radiation Oncology

## 2021-08-31 ENCOUNTER — Inpatient Hospital Stay: Payer: Medicare Other

## 2021-08-31 ENCOUNTER — Other Ambulatory Visit: Payer: Self-pay

## 2021-08-31 DIAGNOSIS — C61 Malignant neoplasm of prostate: Secondary | ICD-10-CM | POA: Insufficient documentation

## 2021-08-31 DIAGNOSIS — Z510 Encounter for antineoplastic radiation therapy: Secondary | ICD-10-CM | POA: Diagnosis not present

## 2021-08-31 LAB — CBC
HCT: 39.6 % (ref 39.0–52.0)
Hemoglobin: 13.9 g/dL (ref 13.0–17.0)
MCH: 32 pg (ref 26.0–34.0)
MCHC: 35.1 g/dL (ref 30.0–36.0)
MCV: 91.2 fL (ref 80.0–100.0)
Platelets: 159 10*3/uL (ref 150–400)
RBC: 4.34 MIL/uL (ref 4.22–5.81)
RDW: 11.9 % (ref 11.5–15.5)
WBC: 5.2 10*3/uL (ref 4.0–10.5)
nRBC: 0 % (ref 0.0–0.2)

## 2021-08-31 LAB — RAD ONC ARIA SESSION SUMMARY
Course Elapsed Days: 14
Plan Fractions Treated to Date: 11
Plan Prescribed Dose Per Fraction: 2 Gy
Plan Total Fractions Prescribed: 40
Plan Total Prescribed Dose: 80 Gy
Reference Point Dosage Given to Date: 22 Gy
Reference Point Session Dosage Given: 2 Gy
Session Number: 11

## 2021-09-01 ENCOUNTER — Other Ambulatory Visit: Payer: Self-pay

## 2021-09-01 ENCOUNTER — Ambulatory Visit
Admission: RE | Admit: 2021-09-01 | Discharge: 2021-09-01 | Disposition: A | Discharge status: Home / Self Care | Payer: Medicare Other | Source: Ambulatory Visit | Attending: Radiation Oncology | Admitting: Radiation Oncology

## 2021-09-01 DIAGNOSIS — C61 Malignant neoplasm of prostate: Secondary | ICD-10-CM | POA: Diagnosis not present

## 2021-09-01 DIAGNOSIS — Z510 Encounter for antineoplastic radiation therapy: Secondary | ICD-10-CM | POA: Diagnosis not present

## 2021-09-01 LAB — RAD ONC ARIA SESSION SUMMARY
Course Elapsed Days: 15
Plan Fractions Treated to Date: 12
Plan Prescribed Dose Per Fraction: 2 Gy
Plan Total Fractions Prescribed: 40
Plan Total Prescribed Dose: 80 Gy
Reference Point Dosage Given to Date: 24 Gy
Reference Point Session Dosage Given: 2 Gy
Session Number: 12

## 2021-09-04 ENCOUNTER — Other Ambulatory Visit: Payer: Self-pay

## 2021-09-04 ENCOUNTER — Ambulatory Visit
Admission: RE | Admit: 2021-09-04 | Discharge: 2021-09-04 | Disposition: A | Discharge status: Home / Self Care | Payer: Medicare Other | Source: Ambulatory Visit | Attending: Radiation Oncology | Admitting: Radiation Oncology

## 2021-09-04 DIAGNOSIS — Z510 Encounter for antineoplastic radiation therapy: Secondary | ICD-10-CM | POA: Diagnosis not present

## 2021-09-04 DIAGNOSIS — C61 Malignant neoplasm of prostate: Secondary | ICD-10-CM | POA: Diagnosis not present

## 2021-09-04 LAB — RAD ONC ARIA SESSION SUMMARY
Course Elapsed Days: 18
Plan Fractions Treated to Date: 13
Plan Prescribed Dose Per Fraction: 2 Gy
Plan Total Fractions Prescribed: 40
Plan Total Prescribed Dose: 80 Gy
Reference Point Dosage Given to Date: 26 Gy
Reference Point Session Dosage Given: 2 Gy
Session Number: 13

## 2021-09-05 ENCOUNTER — Ambulatory Visit
Admission: RE | Admit: 2021-09-05 | Discharge: 2021-09-05 | Disposition: A | Discharge status: Home / Self Care | Payer: Medicare Other | Source: Ambulatory Visit | Attending: Radiation Oncology | Admitting: Radiation Oncology

## 2021-09-05 ENCOUNTER — Other Ambulatory Visit: Payer: Self-pay

## 2021-09-05 DIAGNOSIS — C61 Malignant neoplasm of prostate: Secondary | ICD-10-CM | POA: Diagnosis not present

## 2021-09-05 DIAGNOSIS — Z510 Encounter for antineoplastic radiation therapy: Secondary | ICD-10-CM | POA: Diagnosis not present

## 2021-09-05 LAB — RAD ONC ARIA SESSION SUMMARY
Course Elapsed Days: 19
Plan Fractions Treated to Date: 14
Plan Prescribed Dose Per Fraction: 2 Gy
Plan Total Fractions Prescribed: 40
Plan Total Prescribed Dose: 80 Gy
Reference Point Dosage Given to Date: 28 Gy
Reference Point Session Dosage Given: 2 Gy
Session Number: 14

## 2021-09-06 ENCOUNTER — Ambulatory Visit
Admission: RE | Admit: 2021-09-06 | Discharge: 2021-09-06 | Disposition: A | Discharge status: Home / Self Care | Payer: Medicare Other | Source: Ambulatory Visit | Attending: Radiation Oncology | Admitting: Radiation Oncology

## 2021-09-06 ENCOUNTER — Other Ambulatory Visit: Payer: Self-pay

## 2021-09-06 DIAGNOSIS — Z510 Encounter for antineoplastic radiation therapy: Secondary | ICD-10-CM | POA: Diagnosis not present

## 2021-09-06 DIAGNOSIS — C61 Malignant neoplasm of prostate: Secondary | ICD-10-CM | POA: Diagnosis not present

## 2021-09-06 LAB — RAD ONC ARIA SESSION SUMMARY
Course Elapsed Days: 20
Plan Fractions Treated to Date: 15
Plan Prescribed Dose Per Fraction: 2 Gy
Plan Total Fractions Prescribed: 40
Plan Total Prescribed Dose: 80 Gy
Reference Point Dosage Given to Date: 30 Gy
Reference Point Session Dosage Given: 2 Gy
Session Number: 15

## 2021-09-07 ENCOUNTER — Other Ambulatory Visit: Payer: Self-pay

## 2021-09-07 ENCOUNTER — Ambulatory Visit
Admission: RE | Admit: 2021-09-07 | Discharge: 2021-09-07 | Disposition: A | Discharge status: Home / Self Care | Payer: Medicare Other | Source: Ambulatory Visit | Attending: Radiation Oncology | Admitting: Radiation Oncology

## 2021-09-07 DIAGNOSIS — Z510 Encounter for antineoplastic radiation therapy: Secondary | ICD-10-CM | POA: Diagnosis not present

## 2021-09-07 DIAGNOSIS — C61 Malignant neoplasm of prostate: Secondary | ICD-10-CM | POA: Diagnosis not present

## 2021-09-07 LAB — RAD ONC ARIA SESSION SUMMARY
Course Elapsed Days: 21
Plan Fractions Treated to Date: 16
Plan Prescribed Dose Per Fraction: 2 Gy
Plan Total Fractions Prescribed: 40
Plan Total Prescribed Dose: 80 Gy
Reference Point Dosage Given to Date: 32 Gy
Reference Point Session Dosage Given: 2 Gy
Session Number: 16

## 2021-09-08 ENCOUNTER — Other Ambulatory Visit: Payer: Self-pay

## 2021-09-08 ENCOUNTER — Ambulatory Visit
Admission: RE | Admit: 2021-09-08 | Discharge: 2021-09-08 | Disposition: A | Discharge status: Home / Self Care | Payer: Medicare Other | Source: Ambulatory Visit | Attending: Radiation Oncology | Admitting: Radiation Oncology

## 2021-09-08 DIAGNOSIS — C61 Malignant neoplasm of prostate: Secondary | ICD-10-CM | POA: Diagnosis not present

## 2021-09-08 DIAGNOSIS — Z510 Encounter for antineoplastic radiation therapy: Secondary | ICD-10-CM | POA: Diagnosis not present

## 2021-09-08 LAB — RAD ONC ARIA SESSION SUMMARY
Course Elapsed Days: 22
Plan Fractions Treated to Date: 17
Plan Prescribed Dose Per Fraction: 2 Gy
Plan Total Fractions Prescribed: 40
Plan Total Prescribed Dose: 80 Gy
Reference Point Dosage Given to Date: 34 Gy
Reference Point Session Dosage Given: 2 Gy
Session Number: 17

## 2021-09-11 ENCOUNTER — Ambulatory Visit
Admission: RE | Admit: 2021-09-11 | Discharge: 2021-09-11 | Disposition: A | Discharge status: Home / Self Care | Payer: Medicare Other | Source: Ambulatory Visit | Attending: Radiation Oncology | Admitting: Radiation Oncology

## 2021-09-11 ENCOUNTER — Other Ambulatory Visit: Payer: Self-pay

## 2021-09-11 DIAGNOSIS — C61 Malignant neoplasm of prostate: Secondary | ICD-10-CM | POA: Diagnosis not present

## 2021-09-11 DIAGNOSIS — Z510 Encounter for antineoplastic radiation therapy: Secondary | ICD-10-CM | POA: Diagnosis not present

## 2021-09-11 LAB — RAD ONC ARIA SESSION SUMMARY
Course Elapsed Days: 25
Plan Fractions Treated to Date: 18
Plan Prescribed Dose Per Fraction: 2 Gy
Plan Total Fractions Prescribed: 40
Plan Total Prescribed Dose: 80 Gy
Reference Point Dosage Given to Date: 36 Gy
Reference Point Session Dosage Given: 2 Gy
Session Number: 18

## 2021-09-12 ENCOUNTER — Ambulatory Visit
Admission: RE | Admit: 2021-09-12 | Discharge: 2021-09-12 | Disposition: A | Discharge status: Home / Self Care | Payer: Medicare Other | Source: Ambulatory Visit | Attending: Radiation Oncology | Admitting: Radiation Oncology

## 2021-09-12 ENCOUNTER — Other Ambulatory Visit: Payer: Self-pay

## 2021-09-12 DIAGNOSIS — C61 Malignant neoplasm of prostate: Secondary | ICD-10-CM | POA: Diagnosis not present

## 2021-09-12 DIAGNOSIS — Z510 Encounter for antineoplastic radiation therapy: Secondary | ICD-10-CM | POA: Diagnosis not present

## 2021-09-12 LAB — RAD ONC ARIA SESSION SUMMARY
Course Elapsed Days: 26
Plan Fractions Treated to Date: 19
Plan Prescribed Dose Per Fraction: 2 Gy
Plan Total Fractions Prescribed: 40
Plan Total Prescribed Dose: 80 Gy
Reference Point Dosage Given to Date: 38 Gy
Reference Point Session Dosage Given: 2 Gy
Session Number: 19

## 2021-09-13 ENCOUNTER — Ambulatory Visit
Admission: RE | Admit: 2021-09-13 | Discharge: 2021-09-13 | Disposition: A | Discharge status: Home / Self Care | Payer: Medicare Other | Source: Ambulatory Visit | Attending: Radiation Oncology | Admitting: Radiation Oncology

## 2021-09-13 ENCOUNTER — Other Ambulatory Visit: Payer: Self-pay

## 2021-09-13 DIAGNOSIS — C61 Malignant neoplasm of prostate: Secondary | ICD-10-CM | POA: Diagnosis not present

## 2021-09-13 DIAGNOSIS — Z510 Encounter for antineoplastic radiation therapy: Secondary | ICD-10-CM | POA: Diagnosis not present

## 2021-09-13 LAB — RAD ONC ARIA SESSION SUMMARY
Course Elapsed Days: 27
Plan Fractions Treated to Date: 20
Plan Prescribed Dose Per Fraction: 2 Gy
Plan Total Fractions Prescribed: 40
Plan Total Prescribed Dose: 80 Gy
Reference Point Dosage Given to Date: 40 Gy
Reference Point Session Dosage Given: 2 Gy
Session Number: 20

## 2021-09-14 ENCOUNTER — Inpatient Hospital Stay: Payer: Medicare Other

## 2021-09-14 ENCOUNTER — Other Ambulatory Visit: Payer: Self-pay

## 2021-09-14 ENCOUNTER — Ambulatory Visit
Admission: RE | Admit: 2021-09-14 | Discharge: 2021-09-14 | Disposition: A | Discharge status: Home / Self Care | Payer: Medicare Other | Source: Ambulatory Visit | Attending: Radiation Oncology | Admitting: Radiation Oncology

## 2021-09-14 ENCOUNTER — Other Ambulatory Visit: Payer: Self-pay | Admitting: *Deleted

## 2021-09-14 DIAGNOSIS — Z510 Encounter for antineoplastic radiation therapy: Secondary | ICD-10-CM | POA: Diagnosis not present

## 2021-09-14 DIAGNOSIS — C61 Malignant neoplasm of prostate: Secondary | ICD-10-CM

## 2021-09-14 LAB — CBC
HCT: 40.2 % (ref 39.0–52.0)
Hemoglobin: 14.2 g/dL (ref 13.0–17.0)
MCH: 32.3 pg (ref 26.0–34.0)
MCHC: 35.3 g/dL (ref 30.0–36.0)
MCV: 91.4 fL (ref 80.0–100.0)
Platelets: 181 10*3/uL (ref 150–400)
RBC: 4.4 MIL/uL (ref 4.22–5.81)
RDW: 12.7 % (ref 11.5–15.5)
WBC: 5.2 10*3/uL (ref 4.0–10.5)
nRBC: 0 % (ref 0.0–0.2)

## 2021-09-14 LAB — RAD ONC ARIA SESSION SUMMARY
Course Elapsed Days: 28
Plan Fractions Treated to Date: 21
Plan Prescribed Dose Per Fraction: 2 Gy
Plan Total Fractions Prescribed: 40
Plan Total Prescribed Dose: 80 Gy
Reference Point Dosage Given to Date: 42 Gy
Reference Point Session Dosage Given: 2 Gy
Session Number: 21

## 2021-09-14 MED ORDER — TAMSULOSIN HCL 0.4 MG PO CAPS: 0.4000 mg | ORAL_CAPSULE | Freq: Every day | ORAL | 3 refills | Status: DC

## 2021-09-15 ENCOUNTER — Other Ambulatory Visit: Payer: Self-pay

## 2021-09-15 ENCOUNTER — Ambulatory Visit
Admission: RE | Admit: 2021-09-15 | Discharge: 2021-09-15 | Disposition: A | Discharge status: Home / Self Care | Payer: Medicare Other | Source: Ambulatory Visit | Attending: Radiation Oncology | Admitting: Radiation Oncology

## 2021-09-15 DIAGNOSIS — C61 Malignant neoplasm of prostate: Secondary | ICD-10-CM | POA: Diagnosis not present

## 2021-09-15 DIAGNOSIS — Z510 Encounter for antineoplastic radiation therapy: Secondary | ICD-10-CM | POA: Diagnosis not present

## 2021-09-15 LAB — RAD ONC ARIA SESSION SUMMARY
Course Elapsed Days: 29
Plan Fractions Treated to Date: 22
Plan Prescribed Dose Per Fraction: 2 Gy
Plan Total Fractions Prescribed: 40
Plan Total Prescribed Dose: 80 Gy
Reference Point Dosage Given to Date: 44 Gy
Reference Point Session Dosage Given: 2 Gy
Session Number: 22

## 2021-09-18 ENCOUNTER — Ambulatory Visit
Admission: RE | Admit: 2021-09-18 | Discharge: 2021-09-18 | Disposition: A | Discharge status: Home / Self Care | Payer: Medicare Other | Source: Ambulatory Visit | Attending: Radiation Oncology | Admitting: Radiation Oncology

## 2021-09-18 ENCOUNTER — Other Ambulatory Visit: Payer: Self-pay

## 2021-09-18 DIAGNOSIS — C61 Malignant neoplasm of prostate: Secondary | ICD-10-CM | POA: Diagnosis not present

## 2021-09-18 DIAGNOSIS — Z510 Encounter for antineoplastic radiation therapy: Secondary | ICD-10-CM | POA: Diagnosis not present

## 2021-09-18 LAB — RAD ONC ARIA SESSION SUMMARY
Course Elapsed Days: 32
Plan Fractions Treated to Date: 23
Plan Prescribed Dose Per Fraction: 2 Gy
Plan Total Fractions Prescribed: 40
Plan Total Prescribed Dose: 80 Gy
Reference Point Dosage Given to Date: 46 Gy
Reference Point Session Dosage Given: 2 Gy
Session Number: 23

## 2021-09-19 ENCOUNTER — Other Ambulatory Visit: Payer: Self-pay

## 2021-09-19 ENCOUNTER — Ambulatory Visit
Admission: RE | Admit: 2021-09-19 | Discharge: 2021-09-19 | Disposition: A | Discharge status: Home / Self Care | Payer: Medicare Other | Source: Ambulatory Visit | Attending: Radiation Oncology | Admitting: Radiation Oncology

## 2021-09-19 DIAGNOSIS — Z510 Encounter for antineoplastic radiation therapy: Secondary | ICD-10-CM | POA: Diagnosis not present

## 2021-09-19 DIAGNOSIS — C61 Malignant neoplasm of prostate: Secondary | ICD-10-CM | POA: Diagnosis not present

## 2021-09-19 LAB — RAD ONC ARIA SESSION SUMMARY
Course Elapsed Days: 33
Plan Fractions Treated to Date: 24
Plan Prescribed Dose Per Fraction: 2 Gy
Plan Total Fractions Prescribed: 40
Plan Total Prescribed Dose: 80 Gy
Reference Point Dosage Given to Date: 48 Gy
Reference Point Session Dosage Given: 2 Gy
Session Number: 24

## 2021-09-20 ENCOUNTER — Other Ambulatory Visit: Payer: Self-pay

## 2021-09-20 ENCOUNTER — Ambulatory Visit
Admission: RE | Admit: 2021-09-20 | Discharge: 2021-09-20 | Disposition: A | Discharge status: Home / Self Care | Payer: Medicare Other | Source: Ambulatory Visit | Attending: Radiation Oncology | Admitting: Radiation Oncology

## 2021-09-20 DIAGNOSIS — C61 Malignant neoplasm of prostate: Secondary | ICD-10-CM | POA: Diagnosis not present

## 2021-09-20 DIAGNOSIS — Z510 Encounter for antineoplastic radiation therapy: Secondary | ICD-10-CM | POA: Diagnosis not present

## 2021-09-20 LAB — RAD ONC ARIA SESSION SUMMARY
Course Elapsed Days: 34
Plan Fractions Treated to Date: 25
Plan Prescribed Dose Per Fraction: 2 Gy
Plan Total Fractions Prescribed: 40
Plan Total Prescribed Dose: 80 Gy
Reference Point Dosage Given to Date: 50 Gy
Reference Point Session Dosage Given: 2 Gy
Session Number: 25

## 2021-09-21 ENCOUNTER — Other Ambulatory Visit: Payer: Self-pay

## 2021-09-21 ENCOUNTER — Ambulatory Visit
Admission: RE | Admit: 2021-09-21 | Discharge: 2021-09-21 | Disposition: A | Discharge status: Home / Self Care | Payer: Medicare Other | Source: Ambulatory Visit | Attending: Radiation Oncology | Admitting: Radiation Oncology

## 2021-09-21 DIAGNOSIS — C61 Malignant neoplasm of prostate: Secondary | ICD-10-CM | POA: Diagnosis not present

## 2021-09-21 DIAGNOSIS — Z510 Encounter for antineoplastic radiation therapy: Secondary | ICD-10-CM | POA: Diagnosis not present

## 2021-09-21 LAB — RAD ONC ARIA SESSION SUMMARY
Course Elapsed Days: 35
Plan Fractions Treated to Date: 26
Plan Prescribed Dose Per Fraction: 2 Gy
Plan Total Fractions Prescribed: 40
Plan Total Prescribed Dose: 80 Gy
Reference Point Dosage Given to Date: 52 Gy
Reference Point Session Dosage Given: 2 Gy
Session Number: 26

## 2021-09-22 ENCOUNTER — Ambulatory Visit
Admission: RE | Admit: 2021-09-22 | Discharge: 2021-09-22 | Disposition: A | Discharge status: Home / Self Care | Payer: Medicare Other | Source: Ambulatory Visit | Attending: Radiation Oncology | Admitting: Radiation Oncology

## 2021-09-22 ENCOUNTER — Other Ambulatory Visit: Payer: Self-pay

## 2021-09-22 DIAGNOSIS — C61 Malignant neoplasm of prostate: Secondary | ICD-10-CM | POA: Diagnosis not present

## 2021-09-22 DIAGNOSIS — Z510 Encounter for antineoplastic radiation therapy: Secondary | ICD-10-CM | POA: Diagnosis not present

## 2021-09-22 LAB — RAD ONC ARIA SESSION SUMMARY
Course Elapsed Days: 36
Plan Fractions Treated to Date: 27
Plan Prescribed Dose Per Fraction: 2 Gy
Plan Total Fractions Prescribed: 40
Plan Total Prescribed Dose: 80 Gy
Reference Point Dosage Given to Date: 54 Gy
Reference Point Session Dosage Given: 2 Gy
Session Number: 27

## 2021-09-26 ENCOUNTER — Other Ambulatory Visit: Payer: Self-pay

## 2021-09-26 ENCOUNTER — Ambulatory Visit
Admission: RE | Admit: 2021-09-26 | Discharge: 2021-09-26 | Disposition: A | Discharge status: Home / Self Care | Payer: Medicare Other | Source: Ambulatory Visit | Attending: Radiation Oncology | Admitting: Radiation Oncology

## 2021-09-26 DIAGNOSIS — C61 Malignant neoplasm of prostate: Secondary | ICD-10-CM | POA: Diagnosis not present

## 2021-09-26 DIAGNOSIS — Z510 Encounter for antineoplastic radiation therapy: Secondary | ICD-10-CM | POA: Diagnosis not present

## 2021-09-26 LAB — RAD ONC ARIA SESSION SUMMARY
Course Elapsed Days: 40
Plan Fractions Treated to Date: 28
Plan Prescribed Dose Per Fraction: 2 Gy
Plan Total Fractions Prescribed: 40
Plan Total Prescribed Dose: 80 Gy
Reference Point Dosage Given to Date: 56 Gy
Reference Point Session Dosage Given: 2 Gy
Session Number: 28

## 2021-09-27 ENCOUNTER — Other Ambulatory Visit: Payer: Self-pay

## 2021-09-27 ENCOUNTER — Ambulatory Visit
Admission: RE | Admit: 2021-09-27 | Discharge: 2021-09-27 | Disposition: A | Discharge status: Home / Self Care | Payer: Medicare Other | Source: Ambulatory Visit | Attending: Radiation Oncology | Admitting: Radiation Oncology

## 2021-09-27 DIAGNOSIS — C61 Malignant neoplasm of prostate: Secondary | ICD-10-CM | POA: Diagnosis not present

## 2021-09-27 DIAGNOSIS — Z510 Encounter for antineoplastic radiation therapy: Secondary | ICD-10-CM | POA: Diagnosis not present

## 2021-09-27 LAB — RAD ONC ARIA SESSION SUMMARY
Course Elapsed Days: 41
Plan Fractions Treated to Date: 29
Plan Prescribed Dose Per Fraction: 2 Gy
Plan Total Fractions Prescribed: 40
Plan Total Prescribed Dose: 80 Gy
Reference Point Dosage Given to Date: 58 Gy
Reference Point Session Dosage Given: 2 Gy
Session Number: 29

## 2021-09-28 ENCOUNTER — Ambulatory Visit
Admission: RE | Admit: 2021-09-28 | Discharge: 2021-09-28 | Disposition: A | Discharge status: Home / Self Care | Payer: Medicare Other | Source: Ambulatory Visit | Attending: Oncology | Admitting: Oncology

## 2021-09-28 ENCOUNTER — Inpatient Hospital Stay: Payer: Medicare Other | Attending: Radiation Oncology

## 2021-09-28 ENCOUNTER — Other Ambulatory Visit: Payer: Self-pay

## 2021-09-28 DIAGNOSIS — C61 Malignant neoplasm of prostate: Secondary | ICD-10-CM | POA: Diagnosis not present

## 2021-09-28 DIAGNOSIS — Z510 Encounter for antineoplastic radiation therapy: Secondary | ICD-10-CM | POA: Insufficient documentation

## 2021-09-28 LAB — CBC
HCT: 39.3 % (ref 39.0–52.0)
Hemoglobin: 13.9 g/dL (ref 13.0–17.0)
MCH: 32.6 pg (ref 26.0–34.0)
MCHC: 35.4 g/dL (ref 30.0–36.0)
MCV: 92.3 fL (ref 80.0–100.0)
Platelets: 178 K/uL (ref 150–400)
RBC: 4.26 MIL/uL (ref 4.22–5.81)
RDW: 13.4 % (ref 11.5–15.5)
WBC: 5.9 K/uL (ref 4.0–10.5)
nRBC: 0 % (ref 0.0–0.2)

## 2021-09-28 LAB — RAD ONC ARIA SESSION SUMMARY
Course Elapsed Days: 42
Plan Fractions Treated to Date: 30
Plan Prescribed Dose Per Fraction: 2 Gy
Plan Total Fractions Prescribed: 40
Plan Total Prescribed Dose: 80 Gy
Reference Point Dosage Given to Date: 60 Gy
Reference Point Session Dosage Given: 2 Gy
Session Number: 30

## 2021-09-29 ENCOUNTER — Ambulatory Visit
Admission: RE | Admit: 2021-09-29 | Discharge: 2021-09-29 | Disposition: A | Discharge status: Home / Self Care | Payer: Medicare Other | Source: Ambulatory Visit | Attending: Radiation Oncology | Admitting: Radiation Oncology

## 2021-09-29 ENCOUNTER — Other Ambulatory Visit: Payer: Self-pay

## 2021-09-29 ENCOUNTER — Ambulatory Visit: Payer: Self-pay | Admitting: Internal Medicine

## 2021-09-29 DIAGNOSIS — Z510 Encounter for antineoplastic radiation therapy: Secondary | ICD-10-CM | POA: Diagnosis not present

## 2021-09-29 DIAGNOSIS — C61 Malignant neoplasm of prostate: Secondary | ICD-10-CM | POA: Diagnosis not present

## 2021-09-29 LAB — RAD ONC ARIA SESSION SUMMARY
Course Elapsed Days: 43
Plan Fractions Treated to Date: 31
Plan Prescribed Dose Per Fraction: 2 Gy
Plan Total Fractions Prescribed: 40
Plan Total Prescribed Dose: 80 Gy
Reference Point Dosage Given to Date: 62 Gy
Reference Point Session Dosage Given: 2 Gy
Session Number: 31

## 2021-10-02 ENCOUNTER — Ambulatory Visit
Admission: RE | Admit: 2021-10-02 | Discharge: 2021-10-02 | Disposition: A | Discharge status: Home / Self Care | Payer: Medicare Other | Source: Ambulatory Visit | Attending: Radiation Oncology | Admitting: Radiation Oncology

## 2021-10-02 ENCOUNTER — Other Ambulatory Visit: Payer: Self-pay

## 2021-10-02 DIAGNOSIS — C61 Malignant neoplasm of prostate: Secondary | ICD-10-CM | POA: Diagnosis not present

## 2021-10-02 DIAGNOSIS — Z510 Encounter for antineoplastic radiation therapy: Secondary | ICD-10-CM | POA: Diagnosis not present

## 2021-10-02 LAB — RAD ONC ARIA SESSION SUMMARY
Course Elapsed Days: 46
Plan Fractions Treated to Date: 32
Plan Prescribed Dose Per Fraction: 2 Gy
Plan Total Fractions Prescribed: 40
Plan Total Prescribed Dose: 80 Gy
Reference Point Dosage Given to Date: 64 Gy
Reference Point Session Dosage Given: 0.7072 Gy
Session Number: 32

## 2021-10-03 ENCOUNTER — Other Ambulatory Visit: Payer: Self-pay

## 2021-10-03 ENCOUNTER — Ambulatory Visit
Admission: RE | Admit: 2021-10-03 | Discharge: 2021-10-03 | Disposition: A | Discharge status: Home / Self Care | Payer: Medicare Other | Source: Ambulatory Visit | Attending: Radiation Oncology | Admitting: Radiation Oncology

## 2021-10-03 DIAGNOSIS — C61 Malignant neoplasm of prostate: Secondary | ICD-10-CM | POA: Diagnosis not present

## 2021-10-03 DIAGNOSIS — Z510 Encounter for antineoplastic radiation therapy: Secondary | ICD-10-CM | POA: Diagnosis not present

## 2021-10-03 LAB — RAD ONC ARIA SESSION SUMMARY
Course Elapsed Days: 47
Plan Fractions Treated to Date: 33
Plan Prescribed Dose Per Fraction: 2 Gy
Plan Total Fractions Prescribed: 40
Plan Total Prescribed Dose: 80 Gy
Reference Point Dosage Given to Date: 66 Gy
Reference Point Session Dosage Given: 2 Gy
Session Number: 33

## 2021-10-04 ENCOUNTER — Other Ambulatory Visit: Payer: Self-pay

## 2021-10-04 ENCOUNTER — Ambulatory Visit
Admission: RE | Admit: 2021-10-04 | Discharge: 2021-10-04 | Disposition: A | Discharge status: Home / Self Care | Payer: Medicare Other | Source: Ambulatory Visit | Attending: Radiation Oncology | Admitting: Radiation Oncology

## 2021-10-04 DIAGNOSIS — Z510 Encounter for antineoplastic radiation therapy: Secondary | ICD-10-CM | POA: Diagnosis not present

## 2021-10-04 DIAGNOSIS — C61 Malignant neoplasm of prostate: Secondary | ICD-10-CM | POA: Diagnosis not present

## 2021-10-04 LAB — RAD ONC ARIA SESSION SUMMARY
Course Elapsed Days: 48
Plan Fractions Treated to Date: 34
Plan Prescribed Dose Per Fraction: 2 Gy
Plan Total Fractions Prescribed: 40
Plan Total Prescribed Dose: 80 Gy
Reference Point Dosage Given to Date: 68 Gy
Reference Point Session Dosage Given: 2 Gy
Session Number: 34

## 2021-10-05 ENCOUNTER — Other Ambulatory Visit: Payer: Self-pay

## 2021-10-05 ENCOUNTER — Ambulatory Visit
Admission: RE | Admit: 2021-10-05 | Discharge: 2021-10-05 | Disposition: A | Discharge status: Home / Self Care | Payer: Medicare Other | Source: Ambulatory Visit | Attending: Radiation Oncology | Admitting: Radiation Oncology

## 2021-10-05 DIAGNOSIS — C61 Malignant neoplasm of prostate: Secondary | ICD-10-CM | POA: Diagnosis not present

## 2021-10-05 DIAGNOSIS — Z510 Encounter for antineoplastic radiation therapy: Secondary | ICD-10-CM | POA: Diagnosis not present

## 2021-10-05 LAB — RAD ONC ARIA SESSION SUMMARY
Course Elapsed Days: 49
Plan Fractions Treated to Date: 35
Plan Prescribed Dose Per Fraction: 2 Gy
Plan Total Fractions Prescribed: 40
Plan Total Prescribed Dose: 80 Gy
Reference Point Dosage Given to Date: 70 Gy
Reference Point Session Dosage Given: 2 Gy
Session Number: 35

## 2021-10-06 ENCOUNTER — Ambulatory Visit
Admission: RE | Admit: 2021-10-06 | Discharge: 2021-10-06 | Disposition: A | Discharge status: Home / Self Care | Payer: Medicare Other | Source: Ambulatory Visit | Attending: Radiation Oncology | Admitting: Radiation Oncology

## 2021-10-06 ENCOUNTER — Other Ambulatory Visit: Payer: Self-pay

## 2021-10-06 DIAGNOSIS — Z510 Encounter for antineoplastic radiation therapy: Secondary | ICD-10-CM | POA: Diagnosis not present

## 2021-10-06 DIAGNOSIS — C61 Malignant neoplasm of prostate: Secondary | ICD-10-CM | POA: Diagnosis not present

## 2021-10-06 LAB — RAD ONC ARIA SESSION SUMMARY
Course Elapsed Days: 50
Plan Fractions Treated to Date: 36
Plan Prescribed Dose Per Fraction: 2 Gy
Plan Total Fractions Prescribed: 40
Plan Total Prescribed Dose: 80 Gy
Reference Point Dosage Given to Date: 72 Gy
Reference Point Session Dosage Given: 2 Gy
Session Number: 36

## 2021-10-09 ENCOUNTER — Ambulatory Visit
Admission: RE | Admit: 2021-10-09 | Discharge: 2021-10-09 | Disposition: A | Discharge status: Home / Self Care | Payer: Medicare Other | Source: Ambulatory Visit | Attending: Radiation Oncology | Admitting: Radiation Oncology

## 2021-10-09 ENCOUNTER — Other Ambulatory Visit: Payer: Self-pay

## 2021-10-09 DIAGNOSIS — Z510 Encounter for antineoplastic radiation therapy: Secondary | ICD-10-CM | POA: Diagnosis not present

## 2021-10-09 DIAGNOSIS — C61 Malignant neoplasm of prostate: Secondary | ICD-10-CM | POA: Diagnosis not present

## 2021-10-09 LAB — RAD ONC ARIA SESSION SUMMARY
Course Elapsed Days: 53
Plan Fractions Treated to Date: 37
Plan Prescribed Dose Per Fraction: 2 Gy
Plan Total Fractions Prescribed: 40
Plan Total Prescribed Dose: 80 Gy
Reference Point Dosage Given to Date: 74 Gy
Reference Point Session Dosage Given: 2 Gy
Session Number: 37

## 2021-10-10 ENCOUNTER — Other Ambulatory Visit: Payer: Self-pay

## 2021-10-10 ENCOUNTER — Ambulatory Visit
Admission: RE | Admit: 2021-10-10 | Discharge: 2021-10-10 | Disposition: A | Discharge status: Home / Self Care | Payer: Medicare Other | Source: Ambulatory Visit | Attending: Radiation Oncology | Admitting: Radiation Oncology

## 2021-10-10 DIAGNOSIS — C61 Malignant neoplasm of prostate: Secondary | ICD-10-CM | POA: Diagnosis not present

## 2021-10-10 DIAGNOSIS — Z510 Encounter for antineoplastic radiation therapy: Secondary | ICD-10-CM | POA: Diagnosis not present

## 2021-10-10 LAB — RAD ONC ARIA SESSION SUMMARY
Course Elapsed Days: 54
Plan Fractions Treated to Date: 38
Plan Prescribed Dose Per Fraction: 2 Gy
Plan Total Fractions Prescribed: 40
Plan Total Prescribed Dose: 80 Gy
Reference Point Dosage Given to Date: 76 Gy
Reference Point Session Dosage Given: 2 Gy
Session Number: 38

## 2021-10-11 ENCOUNTER — Ambulatory Visit
Admission: RE | Admit: 2021-10-11 | Discharge: 2021-10-11 | Disposition: A | Discharge status: Home / Self Care | Payer: Medicare Other | Source: Ambulatory Visit | Attending: Radiation Oncology | Admitting: Radiation Oncology

## 2021-10-11 ENCOUNTER — Other Ambulatory Visit: Payer: Self-pay

## 2021-10-11 DIAGNOSIS — C61 Malignant neoplasm of prostate: Secondary | ICD-10-CM | POA: Diagnosis not present

## 2021-10-11 DIAGNOSIS — Z510 Encounter for antineoplastic radiation therapy: Secondary | ICD-10-CM | POA: Diagnosis not present

## 2021-10-11 LAB — RAD ONC ARIA SESSION SUMMARY
Course Elapsed Days: 55
Plan Fractions Treated to Date: 39
Plan Prescribed Dose Per Fraction: 2 Gy
Plan Total Fractions Prescribed: 40
Plan Total Prescribed Dose: 80 Gy
Reference Point Dosage Given to Date: 78 Gy
Reference Point Session Dosage Given: 2 Gy
Session Number: 39

## 2021-10-12 ENCOUNTER — Ambulatory Visit
Admission: RE | Admit: 2021-10-12 | Discharge: 2021-10-12 | Disposition: A | Discharge status: Home / Self Care | Payer: Medicare Other | Source: Ambulatory Visit | Attending: Radiation Oncology | Admitting: Radiation Oncology

## 2021-10-12 ENCOUNTER — Other Ambulatory Visit: Payer: Self-pay

## 2021-10-12 DIAGNOSIS — Z510 Encounter for antineoplastic radiation therapy: Secondary | ICD-10-CM | POA: Diagnosis not present

## 2021-10-12 DIAGNOSIS — C61 Malignant neoplasm of prostate: Secondary | ICD-10-CM | POA: Diagnosis not present

## 2021-10-12 LAB — RAD ONC ARIA SESSION SUMMARY
Course Elapsed Days: 56
Plan Fractions Treated to Date: 40
Plan Prescribed Dose Per Fraction: 2 Gy
Plan Total Fractions Prescribed: 40
Plan Total Prescribed Dose: 80 Gy
Reference Point Dosage Given to Date: 80 Gy
Reference Point Session Dosage Given: 2 Gy
Session Number: 40

## 2021-10-16 DIAGNOSIS — H34812 Central retinal vein occlusion, left eye, with macular edema: Secondary | ICD-10-CM | POA: Diagnosis not present

## 2021-10-27 ENCOUNTER — Other Ambulatory Visit: Payer: Medicare Other

## 2021-10-27 ENCOUNTER — Other Ambulatory Visit: Payer: Self-pay | Admitting: *Deleted

## 2021-10-27 DIAGNOSIS — C61 Malignant neoplasm of prostate: Secondary | ICD-10-CM

## 2021-10-30 ENCOUNTER — Other Ambulatory Visit: Payer: Medicare Other

## 2021-10-30 DIAGNOSIS — C61 Malignant neoplasm of prostate: Secondary | ICD-10-CM

## 2021-10-31 LAB — PSA: Prostate Specific Ag, Serum: <0.1 ng/mL (ref 0.0–4.0)

## 2021-10-31 NOTE — Progress Notes (Signed)
11/01/21 9:32 AM   Elpidio Eric [birth year] [id number]  Referring provider:  Albina Billet, MD [address]   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   His PSA had risen to 92.1 on 06/02/2021. He underwent a prostate biopsy on 06/14/2021. Prostate biopsy was complicated by vasovagal episode he was admitted into the hospital for neurological and cardiovascular work-up which was all negative.    Surgical pathology was consistent with Gleason 4+3 involving three cores affecting up to 91%, Gleason 3+4 involving 6 cores affecting up to 100%, Gleason 3+3 involving 3 cores affecting up to 25%.    PSMA PET on 06/28/2021 scan visualized Intense radiotracer activity within LEFT RIGHT lobe prostate gland consistent with high-grade prostate adenocarcinoma. Multiple enlarged intensely radiotracer avid lymph nodes primarily in medial rectum of the pelvis. Single retroperitoneal periaortic metastatic node just above the bifurcation. This is the most superior metastatic node in the abdomen pelvis. No evidence of visceral metastasis.Solitary intensely radiotracer avid bone lesion in the T7 vertebral body. Differential would include benign activity associated with a Schmorl's node versus solitary skeletal metastasis. CT findings favor Schmorl's nodes   He was seen and evaluated by radiation and medical oncology with plans to treat with whole pelvic radiation up to his aortic bifurcation. This was reserved for progression per Dr Grayland Ormond.   He has undergone right lower extremity angiography and stenting for peripheral artery disease.  He is on plavix and aspirin.  S/p IMRT completed 10/12/21.  His PSA on 10/30/2021 was undetectable.   Currently on Eligard  (last depo 08/02/21 6 mo)  and Nubeqa.    Today his only complaint is urinary frequency.  He gets up 5 times at night to urinate, less in the daytime.  No dysuria or gross hematuria.  PVR 90 cc.  He tried Flomax was having dizziness not able to tolerate this medication.  PMH: Past Medical History:  Diagnosis Date   Prostate cancer [facility])    Skin cancer face   Basal cell carcinoma of nose    Surgical History: Past Surgical History:  Procedure Laterality Date   BACK SURGERY     04/30/1984 - 04/29/1985   EYE SURGERY Left 04/08/2020   TRANSFER ADJACENT TISSUE EYELID, NOSE, EAR AND/OR LIP   LOWER EXTREMITY ANGIOGRAPHY Right 07/24/2021   Procedure: Lower Extremity Angiography;  Surgeon: Algernon Huxley, MD;  Location: [facility];  Service: Cardiovascular;  Laterality: Right;   MOHS SURGERY     04/30/2009 - 04/29/2010   skin cancer removal  2015   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 If you have any questions, ask your nurse or doctor.          STOP taking these medications    tamsulosin 0.4 MG Caps capsule Commonly known as: FLOMAX Stopped by: Hollice Espy, MD       TAKE these medications    aspirin EC 81 MG tablet Take 1 tablet (81 mg total) by mouth daily.   atorvastatin 10 MG tablet Commonly known as:  Lipitor Take 1 tablet (10 mg total) by mouth daily.   clopidogrel 75 MG tablet Commonly known as: Plavix Take 1 tablet (75 mg total) by mouth daily.   Gemtesa 75 MG Tabs Generic drug: Vibegron Take 75 mg by mouth daily. Started by: Hollice Espy, MD   lisinopril 10 MG tablet Commonly known as: ZESTRIL Take 10 mg by mouth daily.   Nubeqa 300 MG tablet Generic drug: darolutamide Take 600 mg by mouth 2 (two) times daily with a meal.        Allergies:  No Known Allergies  Family History: History reviewed. No pertinent family  history.  Social History:  reports that he has been smoking cigarettes. He has a 50.00 pack-year smoking history. He has been exposed to tobacco smoke. He has never used smokeless tobacco. He reports current alcohol use. He reports that he does not use drugs.   Physical Exam: BP (!) 151/76   Pulse 71   Ht '5\' 10"'$  (1.778 m)   Wt 190 lb (86.2 kg)   BMI 27.26 kg/m   Constitutional:  Alert and oriented, No acute distress.  Companied by his wife today. HEENT: [place] AT, moist mucus membranes.  Trachea midline, no masses. Cardiovascular: No clubbing, cyanosis, or edema. Respiratory: Normal respiratory effort, no increased work of breathing. Skin: No rashes, bruises or suspicious lesions. Neurologic: Grossly intact, no focal deficits, moving all 4 extremities. Psychiatric: Normal mood and affect.  Laboratory Data:  Lab Results  Component Value Date   CREATININE 0.96 07/24/2021   Lab Results  Component Value Date   HGBA1C 5.3 06/14/2021   Results for orders placed or performed in visit on 11/01/21  BLADDER SCAN AMB NON-IMAGING  Result Value Ref Range   Scan Result 90 ml      Assessment & Plan:    1. Prostate cancer ([place]) No evidence of disease status post IMRT currently on ADT in the form of Eligard/Nubeqa for castrate sensitive metastatic disease  Notes aortic bifurcation treated with IMRT with goals for definitive therapy, will continue ADT for total of 2 to 3 years and then reassess  We will do be due for Eligard again in about 3 months, will schedule follow-up at that time and check another PSA  Continue Nubeqa - BLADDER SCAN AMB NON-IMAGING  2. Urinary frequency Likely exacerbated by radiation, unable to tolerate Flomax  He is given samples of Gemtesa 75 mg x 1 month today for the short-term.  Advised to let us know if he finds this to be helpful and would like to continue.  Follow-up for next Eligard/PSA on the same day   [facility] [address] [place], [place] [address] [phone]

## 2021-11-01 ENCOUNTER — Encounter: Payer: Self-pay | Admitting: Urology

## 2021-11-01 ENCOUNTER — Ambulatory Visit (INDEPENDENT_AMBULATORY_CARE_PROVIDER_SITE_OTHER): Payer: Medicare Other | Admitting: Urology

## 2021-11-01 VITALS — BP 151/76 | HR 71 | Ht 70.0 in | Wt 190.0 lb

## 2021-11-01 DIAGNOSIS — R350 Frequency of micturition: Secondary | ICD-10-CM

## 2021-11-01 DIAGNOSIS — I70211 Atherosclerosis of native arteries of extremities with intermittent claudication, right leg: Secondary | ICD-10-CM | POA: Diagnosis not present

## 2021-11-01 DIAGNOSIS — C61 Malignant neoplasm of prostate: Secondary | ICD-10-CM

## 2021-11-01 LAB — BLADDER SCAN AMB NON-IMAGING: Scan Result: 90

## 2021-11-01 MED ORDER — GEMTESA 75 MG PO TABS: 75.0000 mg | ORAL_TABLET | Freq: Every day | ORAL | 0 refills | Status: DC

## 2021-11-13 ENCOUNTER — Ambulatory Visit: Payer: Medicare Other | Admitting: Radiation Oncology

## 2021-11-21 ENCOUNTER — Other Ambulatory Visit (INDEPENDENT_AMBULATORY_CARE_PROVIDER_SITE_OTHER): Payer: Self-pay | Admitting: Nurse Practitioner

## 2021-11-21 DIAGNOSIS — I739 Peripheral vascular disease, unspecified: Secondary | ICD-10-CM

## 2021-11-22 ENCOUNTER — Ambulatory Visit
Admission: RE | Admit: 2021-11-22 | Discharge: 2021-11-22 | Disposition: A | Discharge status: Home / Self Care | Payer: Medicare Other | Source: Ambulatory Visit | Attending: Radiation Oncology | Admitting: Radiation Oncology

## 2021-11-22 ENCOUNTER — Other Ambulatory Visit: Payer: Self-pay | Admitting: *Deleted

## 2021-11-22 ENCOUNTER — Ambulatory Visit (INDEPENDENT_AMBULATORY_CARE_PROVIDER_SITE_OTHER): Payer: Medicare Other

## 2021-11-22 ENCOUNTER — Encounter (INDEPENDENT_AMBULATORY_CARE_PROVIDER_SITE_OTHER): Payer: Self-pay | Admitting: Nurse Practitioner

## 2021-11-22 ENCOUNTER — Encounter: Payer: Self-pay | Admitting: Radiation Oncology

## 2021-11-22 ENCOUNTER — Ambulatory Visit (INDEPENDENT_AMBULATORY_CARE_PROVIDER_SITE_OTHER): Payer: Medicare Other | Admitting: Nurse Practitioner

## 2021-11-22 VITALS — BP 133/73 | HR 82 | Resp 16 | Wt 191.0 lb

## 2021-11-22 DIAGNOSIS — I739 Peripheral vascular disease, unspecified: Secondary | ICD-10-CM

## 2021-11-22 DIAGNOSIS — Z9889 Other specified postprocedural states: Secondary | ICD-10-CM | POA: Diagnosis not present

## 2021-11-22 DIAGNOSIS — Z720 Tobacco use: Secondary | ICD-10-CM | POA: Diagnosis not present

## 2021-11-22 DIAGNOSIS — I70211 Atherosclerosis of native arteries of extremities with intermittent claudication, right leg: Secondary | ICD-10-CM | POA: Diagnosis not present

## 2021-11-22 DIAGNOSIS — C61 Malignant neoplasm of prostate: Secondary | ICD-10-CM

## 2021-11-22 NOTE — Progress Notes (Signed)
Radiation Oncology Follow up Note  Name: Howard Stafford   Date:   11/22/2021 MRN:  [id number] DOB: [birth year]    This 76 y.o. male presents to the clinic today for 1 month follow-up status post IMRT radiation therapy for stage IVa (T2 N1 M0) Gleason 7 (4+3) adenocarcinoma the prostate presenting with a PSA in the 90 range.Marland Kitchen  REFERRING PROVIDER: Albina Billet, MD  HPI: Patient is a 76 year old male now at 1 month having completed IMRT radiation therapy to his prostate and pelvic nodes for stage IVa Gleason 7 (4+3) adenocarcinoma the prostate presenting with a PSA in the 90 range.  Seen today in routine follow-up he is doing fairly well.  He continues to have some fatigue is also having some hot flashes for which again I have recommended some vitamin D treatment.  He is currently on ADT therapy.  He specifically denies any increased lower urinary tract symptoms diarrhea..  COMPLICATIONS OF TREATMENT: none  FOLLOW UP COMPLIANCE: keeps appointments   PHYSICAL EXAM:  BP (!) (P) 147/78 (BP Location: Left Arm, Patient Position: Sitting)   Pulse (P) 73   Resp (P) 18   Wt (P) 190 lb (86.2 kg)   BMI (P) 27.26 kg/m  Well-developed well-nourished patient in NAD. HEENT reveals PERLA, EOMI, discs not visualized.  Oral cavity is clear. No oral mucosal lesions are identified. Neck is clear without evidence of cervical or supraclavicular adenopathy. Lungs are clear to A&P. Cardiac examination is essentially unremarkable with regular rate and rhythm without murmur rub or thrill. Abdomen is benign with no organomegaly or masses noted. Motor sensory and DTR levels are equal and symmetric in the upper and lower extremities. Cranial nerves II through XII are grossly intact. Proprioception is intact. No peripheral adenopathy or edema is identified. No motor or sensory levels are noted. Crude visual fields are within normal range.  RADIOLOGY RESULTS: No current films for review  PLAN: Present time patient is  clinically doing well low side effect profile.  Again have suggested vitamin E for his hot flashes.  I will see him back in 3 months for follow-up with a PSA at that time.  Patient will continue on ADT therapy.  I would like to take this opportunity to thank you for allowing me to participate in the care of your patient.Noreene Filbert, MD

## 2021-11-29 DIAGNOSIS — E785 Hyperlipidemia, unspecified: Secondary | ICD-10-CM | POA: Diagnosis not present

## 2021-11-29 DIAGNOSIS — I10 Essential (primary) hypertension: Secondary | ICD-10-CM | POA: Diagnosis not present

## 2021-12-04 DIAGNOSIS — H34812 Central retinal vein occlusion, left eye, with macular edema: Secondary | ICD-10-CM | POA: Diagnosis not present

## 2021-12-06 ENCOUNTER — Encounter (INDEPENDENT_AMBULATORY_CARE_PROVIDER_SITE_OTHER): Payer: Self-pay | Admitting: Nurse Practitioner

## 2021-12-06 ENCOUNTER — Telehealth (INDEPENDENT_AMBULATORY_CARE_PROVIDER_SITE_OTHER): Payer: Self-pay | Admitting: Vascular Surgery

## 2021-12-06 NOTE — Telephone Encounter (Signed)
Patients wife called in stating that the patient is in need of an appt asap. Patient is having some extreme pain in his legs...    Please call and advise

## 2021-12-06 NOTE — Telephone Encounter (Signed)
Which leg? What sort of pain? When did it start

## 2021-12-06 NOTE — Progress Notes (Signed)
Subjective:    Patient ID: Howard Stafford, male    DOB: 08/18/1945, 76 y.o.   MRN: [id number] Chief Complaint  Patient presents with   Follow-up    Ultrasound follow up    The patient returns to the office for followup and review status post angiogram with intervention on 07/24/2021.    Procedure: Procedure(s) Performed:             1.  Ultrasound guidance for vascular access left femoral artery             2.  Catheter placement into right common femoral artery from left femoral approach             3.  Aortogram and selective right lower extremity angiogram             4.  Percutaneous transluminal angioplasty of left external iliac artery with 7 mm diameter by 6 cm length Lutonix drug-coated angioplasty balloon             5.  Percutaneous transluminal angioplasty of right posterior tibial artery with 3 mm diameter by 10 cm length angioplasty balloon             6.  Mechanical thrombectomy of the right SFA with the [place] Rex device             7.  Percutaneous transluminal angioplasty of the right SFA with 5 mm diameter by 30 cm length Lutonix drug-coated angioplasty balloon             8.  Viabahn stent placement to the right SFA with 6 mm diameter by 25 cm length stent             9.  Percutaneous transluminal angioplasty of the right external iliac artery with 7 mm diameter by 6 cm length Lutonix drug-coated angioplasty balloon             10.  Stent placement to the right external iliac artery for greater than 50% residual stenosis after angioplasty with 10 mm diameter by 6 cm length life star stent             11.  StarClose closure device left femoral artery   The patient notes improvement in the lower extremity symptoms. No interval shortening of the patient's claudication distance or rest pain symptoms. No new ulcers or wounds have occurred since the last visit.   The patient has completed previous radiation treatment. No documented history of amaurosis fugax or recent TIA  symptoms. There are no recent neurological changes noted. No documented history of DVT, PE or superficial thrombophlebitis. The patient denies recent episodes of angina or shortness of breath.    ABI's Rt=1.12 and Lt=0.66  (previous ABI's Rt=1.15 and Lt=0.77) Additional duplex of the right lower extremity shows biphasic waveforms throughout the right lower extremity with patent stents.  Left lower extremity has monophasic waveforms    Review of Systems  All other systems reviewed and are negative.      Objective:   Physical Exam Vitals reviewed.  HENT:     Head: Normocephalic.  Cardiovascular:     Rate and Rhythm: Normal rate.     Pulses:          Dorsalis pedis pulses are detected w/ Doppler on the right side and detected w/ Doppler on the left side.       Posterior tibial pulses are detected w/ Doppler on the right side and detected w/ Doppler on  the left side.  Pulmonary:     Effort: Pulmonary effort is normal.  Skin:    General: Skin is warm and dry.  Neurological:     Mental Status: He is alert and oriented to person, place, and time.  Psychiatric:        Mood and Affect: Mood normal.        Behavior: Behavior normal.        Thought Content: Thought content normal.        Judgment: Judgment normal.     BP 133/73 (BP Location: Right Arm)   Pulse 82   Resp 16   Wt 191 lb (86.6 kg)   BMI 27.41 kg/m   Past Medical History:  Diagnosis Date   Prostate cancer ([place])    Skin cancer face   Basal cell carcinoma of nose    Social History   Socioeconomic History   Marital status: Married    Spouse name: Not on file   Number of children: Not on file   Years of education: Not on file   Highest education level: Not on file  Occupational History   Not on file  Tobacco Use   Smoking status: Every Day    Packs/day: 1.00    Years: 50.00    Total pack years: 50.00    Types: Cigarettes    Passive exposure: Current   Smokeless tobacco: Never  Substance and Sexual  Activity   Alcohol use: Yes    Comment: Everynight [name]   Drug use: No   Sexual activity: Not on file  Other Topics Concern   Not on file  Social History Narrative   ** Merged History Encounter **       Social Determinants of Health   Financial Resource Strain: Not on file  Food Insecurity: Not on file  Transportation Needs: Not on file  Physical Activity: Not on file  Stress: Not on file  Social Connections: Not on file  Intimate Partner Violence: Not on file    Past Surgical History:  Procedure Laterality Date   BACK SURGERY     04/30/1984 - 04/29/1985   EYE SURGERY Left 04/08/2020   TRANSFER ADJACENT TISSUE EYELID, NOSE, EAR AND/OR LIP   LOWER EXTREMITY ANGIOGRAPHY Right 07/24/2021   Procedure: Lower Extremity Angiography;  Surgeon: Algernon Huxley, MD;  Location: [facility];  Service: Cardiovascular;  Laterality: Right;   MOHS SURGERY     04/30/2009 - 04/29/2010   skin cancer removal  2015   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 No pertinent family history.  No Known Allergies     Latest Ref Rng & Units 09/28/2021    3:22 PM 09/14/2021    3:11 PM 08/31/2021    2:43 PM  CBC  WBC 4.0 - 10.5 K/uL 5.9  5.2  5.2   Hemoglobin 13.0 - 17.0 g/dL 13.9  14.2  13.9   Hematocrit 39.0 - 52.0 % 39.3  40.2  39.6   Platelets 150 - 400 K/uL 178  181  159       CMP     Component Value Date/Time   NA 135 06/14/2021 1511   K 3.6 06/14/2021 1511   CL 104 06/14/2021 1511   CO2 22 06/14/2021 1511   GLUCOSE 130 (H) 06/14/2021 1511   BUN 17 07/24/2021 0952   CREATININE 0.96 07/24/2021 0952   CALCIUM 9.1 06/14/2021 1511   PROT 7.2 06/14/2021 1511   ALBUMIN 4.0 06/14/2021  1511   AST 29 06/14/2021 1511   ALT 32 06/14/2021 1511   ALKPHOS 86 06/14/2021 1511   BILITOT 0.8 06/14/2021 1511   GFRNONAA >60 07/24/2021 0952     VAS [place] ABI WITH/WO TBI  Result Date: 11/23/2021  LOWER EXTREMITY DOPPLER STUDY Patient Name:  Howard Stafford  Date of Exam:    11/22/2021 Medical Rec #: [id number]           Accession #:    [id number] Date of Birth: Oct 11, 1945            Patient Gender: M Patient Age:   49 years Exam Location:  [facility] Procedure:      VAS [place] ABI WITH/WO TBI Referring Phys: Eulogio Ditch --------------------------------------------------------------------------------  Indications: Peripheral artery disease. High Risk Factors: Current smoker. Other Factors: Patient reports right leg symptoms have resolved.  Vascular Interventions: 07/24/2021: Aortogram and Selective Right Lower                         Extremity Angiogram. PTA of the Left EIA with 7 mm                         diameter by 6 cm length Lutonix drug coated angioplasty                         balloon. PTA of the Right Posterior Tibial Artery with 3                         mm diameter by 10 cm length angioplasty balloon.                         Mechanical Thrombectomy of the Right SFA with the [place]                         Rex device. PTA of the Right SFAwith 5 mm diameter by 30                         cm length drug coated drug coated angioplasty balloon.                         Viabahn Stent to the Right SFA with 6 mm diameter by 25                         cm length stent. PTA of the Right EIA Stent placement of                         the Right EIA for > 50% residual after angioplasty. Performing Technologist: Delorise Shiner RVT  Examination Guidelines: A complete evaluation includes at minimum, Doppler waveform signals and systolic blood pressure reading at the level of bilateral brachial, anterior tibial, and posterior tibial arteries, when vessel segments are accessible. Bilateral testing is considered an integral part of a complete examination. Photoelectric Plethysmograph (PPG) waveforms and toe systolic pressure readings are included as required and additional duplex testing as needed. Limited examinations for reoccurring indications may be performed as noted.  ABI Findings:  +---------+------------------+-----+---------+--------+ Right    Rt Pressure (mmHg)IndexWaveform Comment  +---------+------------------+-----+---------+--------+ Brachial 134                                      +---------+------------------+-----+---------+--------+  ATA      150               1.12 triphasic         +---------+------------------+-----+---------+--------+ PTA      118               0.88 biphasic          +---------+------------------+-----+---------+--------+ Great Toe99                0.74                   +---------+------------------+-----+---------+--------+ +---------+------------------+-----+----------+-------+ Left     Lt Pressure (mmHg)IndexWaveform  Comment +---------+------------------+-----+----------+-------+ Brachial 132                                      +---------+------------------+-----+----------+-------+ ATA      88                0.66 monophasic        +---------+------------------+-----+----------+-------+ PTA      89                0.66 monophasic        +---------+------------------+-----+----------+-------+ Great Toe44                0.33                   +---------+------------------+-----+----------+-------+ +-------+-----------+-----------+------------+------------+ ABI/TBIToday's ABIToday's TBIPrevious ABIPrevious TBI +-------+-----------+-----------+------------+------------+ Right  1.12       0.74       1.15        0.99         +-------+-----------+-----------+------------+------------+ Left   0.66       0.33       0.77        0.68         +-------+-----------+-----------+------------+------------+ Right ABIs appear essentially unchanged compared to prior study on 08/23/2021. Left ABIs appear decreased compared to prior study on 08/23/2021.  Summary: Right: Resting right ankle-brachial index is within normal range. No evidence of significant right lower extremity arterial disease. The right  toe-brachial index is normal. Left: Resting left ankle-brachial index indicates moderate left lower extremity arterial disease. The left toe-brachial index is abnormal. *See table(s) above for measurements and observations.  Electronically signed by [name] MD on 11/23/2021 at 7:18:48 AM.    Final        Assessment & Plan:   1. Atherosclerosis of native artery of right lower extremity with intermittent claudication (HCC)  Recommend:  The patient has evidence of atherosclerosis of the lower extremities with claudication.  The patient does not voice lifestyle limiting changes at this point in time.  Noninvasive studies do not suggest clinically significant change.  No invasive studies, angiography or surgery at this time The patient should continue walking and begin a more formal exercise program.  The patient should continue antiplatelet therapy and aggressive treatment of the lipid abnormalities  No changes in the patient's medications at this time  Continued surveillance is indicated as atherosclerosis is likely to progress with time.    The patient will continue follow up with noninvasive studies as ordered.    2. Tobacco abuse Smoking cessation was discussed, 3-10 minutes spent on this topic specifically    Current Outpatient Medications on File Prior to Visit  Medication Sig Dispense Refill   aspirin EC 81 MG tablet Take 1 tablet (81  mg total) by mouth daily. 150 tablet 2   atorvastatin (LIPITOR) 10 MG tablet Take 1 tablet (10 mg total) by mouth daily. 30 tablet 11   clopidogrel (PLAVIX) 75 MG tablet Take 1 tablet (75 mg total) by mouth daily. 30 tablet 11   darolutamide (NUBEQA) 300 MG tablet Take 600 mg by mouth 2 (two) times daily with a meal.     lisinopril (ZESTRIL) 10 MG tablet Take 10 mg by mouth daily.     Vibegron (GEMTESA) 75 MG TABS Take 75 mg by mouth daily. 30 tablet 0   No current facility-administered medications on file prior to visit.    There are no  Patient Instructions on file for this visit. No follow-ups on file.   Kris Hartmann, NP

## 2021-12-07 ENCOUNTER — Other Ambulatory Visit (INDEPENDENT_AMBULATORY_CARE_PROVIDER_SITE_OTHER): Payer: Self-pay | Admitting: Nurse Practitioner

## 2021-12-07 ENCOUNTER — Ambulatory Visit (INDEPENDENT_AMBULATORY_CARE_PROVIDER_SITE_OTHER): Payer: Medicare Other

## 2021-12-07 ENCOUNTER — Telehealth (INDEPENDENT_AMBULATORY_CARE_PROVIDER_SITE_OTHER): Payer: Self-pay

## 2021-12-07 DIAGNOSIS — M79604 Pain in right leg: Secondary | ICD-10-CM

## 2021-12-07 DIAGNOSIS — I739 Peripheral vascular disease, unspecified: Secondary | ICD-10-CM

## 2021-12-07 DIAGNOSIS — Z9889 Other specified postprocedural states: Secondary | ICD-10-CM

## 2021-12-07 NOTE — Telephone Encounter (Signed)
Spoke with the patient and gave him the pre-procedure instructions for his RLE angio on 12/08/21 with a 12:30 pm arrival time to the MM with Dr. Lucky Cowboy.

## 2021-12-07 NOTE — Telephone Encounter (Signed)
Can we get him in today with a RLE art duplex only and we can try to work him in.

## 2021-12-08 ENCOUNTER — Encounter
Admission: RE | Disposition: A | Discharge status: Home / Self Care | Payer: Self-pay | Source: Home / Self Care | Attending: Vascular Surgery

## 2021-12-08 ENCOUNTER — Ambulatory Visit
Admission: RE | Admit: 2021-12-08 | Discharge: 2021-12-08 | Disposition: A | Discharge status: Home / Self Care | Payer: Medicare Other | Attending: Vascular Surgery | Admitting: Vascular Surgery

## 2021-12-08 DIAGNOSIS — F17200 Nicotine dependence, unspecified, uncomplicated: Secondary | ICD-10-CM | POA: Diagnosis not present

## 2021-12-08 DIAGNOSIS — T82868A Thrombosis of vascular prosthetic devices, implants and grafts, initial encounter: Secondary | ICD-10-CM

## 2021-12-08 DIAGNOSIS — I70221 Atherosclerosis of native arteries of extremities with rest pain, right leg: Secondary | ICD-10-CM | POA: Insufficient documentation

## 2021-12-08 DIAGNOSIS — Z95828 Presence of other vascular implants and grafts: Secondary | ICD-10-CM

## 2021-12-08 DIAGNOSIS — Z9889 Other specified postprocedural states: Secondary | ICD-10-CM | POA: Diagnosis not present

## 2021-12-08 DIAGNOSIS — T82856A Stenosis of peripheral vascular stent, initial encounter: Secondary | ICD-10-CM

## 2021-12-08 DIAGNOSIS — I743 Embolism and thrombosis of arteries of the lower extremities: Secondary | ICD-10-CM

## 2021-12-08 DIAGNOSIS — I998 Other disorder of circulatory system: Secondary | ICD-10-CM

## 2021-12-08 DIAGNOSIS — Z7982 Long term (current) use of aspirin: Secondary | ICD-10-CM | POA: Insufficient documentation

## 2021-12-08 HISTORY — PX: LOWER EXTREMITY ANGIOGRAPHY: CATH118251

## 2021-12-08 LAB — BUN: BUN: 23 mg/dL (ref 8–23)

## 2021-12-08 LAB — CREATININE, SERUM
Creatinine, Ser: 0.93 mg/dL (ref 0.61–1.24)
GFR, Estimated: >60 mL/min (ref >60)

## 2021-12-08 SURGERY — LOWER EXTREMITY ANGIOGRAPHY
Anesthesia: Moderate Sedation | Site: Leg Lower | Laterality: Right

## 2021-12-08 MED ORDER — ACETAMINOPHEN 325 MG PO TABS
650.0000 mg | ORAL_TABLET | ORAL | Status: DC | PRN
Start: 2021-12-08 — End: 2021-12-09

## 2021-12-08 MED ORDER — APIXABAN 5 MG PO TABS: 5.0000 mg | ORAL_TABLET | Freq: Two times a day (BID) | ORAL | 2 refills | Status: DC

## 2021-12-08 MED ORDER — HEPARIN SODIUM (PORCINE) 1000 UNIT/ML IJ SOLN
INTRAMUSCULAR | Status: AC
  Filled 2021-12-08: qty 10

## 2021-12-08 MED ORDER — METHYLPREDNISOLONE SODIUM SUCC 125 MG IJ SOLR
125.0000 mg | Freq: Once | INTRAMUSCULAR | Status: DC | PRN
Start: 2021-12-08 — End: 2021-12-09

## 2021-12-08 MED ORDER — CEFAZOLIN SODIUM-DEXTROSE 1-4 GM/50ML-% IV SOLN
INTRAVENOUS | Status: DC | PRN
  Administered 2021-12-08: 2 g via INTRAVENOUS

## 2021-12-08 MED ORDER — MIDAZOLAM HCL 5 MG/5ML IJ SOLN
INTRAMUSCULAR | Status: AC
  Filled 2021-12-08: qty 5

## 2021-12-08 MED ORDER — ONDANSETRON HCL 4 MG/2ML IJ SOLN: 4.0000 mg | Freq: Four times a day (QID) | INTRAMUSCULAR | Status: DC | PRN

## 2021-12-08 MED ORDER — SODIUM CHLORIDE 0.9% FLUSH
3.0000 mL | Freq: Two times a day (BID) | INTRAVENOUS | Status: DC
Start: 2021-12-08 — End: 2021-12-09

## 2021-12-08 MED ORDER — HYDRALAZINE HCL 20 MG/ML IJ SOLN: 5.0000 mg | INTRAMUSCULAR | Status: DC | PRN

## 2021-12-08 MED ORDER — CEFAZOLIN SODIUM-DEXTROSE 2-4 GM/100ML-% IV SOLN: 2.0000 g | INTRAVENOUS | Status: DC

## 2021-12-08 MED ORDER — DIPHENHYDRAMINE HCL 50 MG/ML IJ SOLN
50.0000 mg | Freq: Once | INTRAMUSCULAR | Status: DC | PRN
Start: 2021-12-08 — End: 2021-12-09

## 2021-12-08 MED ORDER — HYDROMORPHONE HCL 1 MG/ML IJ SOLN: 1.0000 mg | Freq: Once | INTRAMUSCULAR | Status: DC | PRN

## 2021-12-08 MED ORDER — HEPARIN SODIUM (PORCINE) 1000 UNIT/ML IJ SOLN
INTRAMUSCULAR | Status: DC | PRN
  Administered 2021-12-08: 5000 [IU] via INTRAVENOUS

## 2021-12-08 MED ORDER — MIDAZOLAM HCL 2 MG/ML PO SYRP: 8.0000 mg | ORAL_SOLUTION | Freq: Once | ORAL | Status: DC | PRN

## 2021-12-08 MED ORDER — SODIUM CHLORIDE 0.9 % IV SOLN: 250.0000 mL | INTRAVENOUS | Status: DC | PRN

## 2021-12-08 MED ORDER — CEFAZOLIN SODIUM-DEXTROSE 2-4 GM/100ML-% IV SOLN
INTRAVENOUS | Status: AC
  Filled 2021-12-08: qty 100

## 2021-12-08 MED ORDER — SODIUM CHLORIDE 0.9% FLUSH: 3.0000 mL | INTRAVENOUS | Status: DC | PRN

## 2021-12-08 MED ORDER — LABETALOL HCL 5 MG/ML IV SOLN: 10.0000 mg | INTRAVENOUS | Status: DC | PRN

## 2021-12-08 MED ORDER — ALTEPLASE 1 MG/ML SYRINGE FOR VASCULAR PROCEDURE
INTRAMUSCULAR | Status: DC | PRN
  Administered 2021-12-08: 6 mg via INTRA_ARTERIAL

## 2021-12-08 MED ORDER — FENTANYL CITRATE (PF) 100 MCG/2ML IJ SOLN
INTRAMUSCULAR | Status: AC
  Filled 2021-12-08: qty 2

## 2021-12-08 MED ORDER — FENTANYL CITRATE (PF) 100 MCG/2ML IJ SOLN
INTRAMUSCULAR | Status: DC | PRN
  Administered 2021-12-08: 50 ug via INTRAVENOUS

## 2021-12-08 MED ORDER — ALTEPLASE 2 MG IJ SOLR
INTRAMUSCULAR | Status: AC
  Filled 2021-12-08: qty 6

## 2021-12-08 MED ORDER — SODIUM CHLORIDE 0.9 % IV SOLN
INTRAVENOUS | Status: DC
Start: 2021-12-08 — End: 2021-12-09

## 2021-12-08 MED ORDER — MIDAZOLAM HCL 2 MG/2ML IJ SOLN
INTRAMUSCULAR | Status: DC | PRN
  Administered 2021-12-08: 2 mg via INTRAVENOUS

## 2021-12-08 MED ORDER — SODIUM CHLORIDE 0.9 % IV SOLN: INTRAVENOUS | Status: DC

## 2021-12-08 MED ORDER — FAMOTIDINE 20 MG PO TABS: 40.0000 mg | ORAL_TABLET | Freq: Once | ORAL | Status: DC | PRN

## 2021-12-08 SURGICAL SUPPLY — 17 items
CANISTER PENUMBRA ENGINE (MISCELLANEOUS) ×1 IMPLANT
CATH ANGIO 5F PIGTAIL 65CM (CATHETERS) ×1 IMPLANT
CATH INDIGO CAT6 KIT (CATHETERS) ×1 IMPLANT
CATH VERT 5FR 125CM (CATHETERS) ×1 IMPLANT
DEVICE STARCLOSE SE CLOSURE (Vascular Products) ×1 IMPLANT
GLIDEWIRE ADV .035X260CM (WIRE) ×1 IMPLANT
KIT ENCORE 26 ADVANTAGE (KITS) ×1 IMPLANT
PACK ANGIOGRAPHY (CUSTOM PROCEDURE TRAY) ×2 IMPLANT
SHEATH BRITE TIP 5FRX11 (SHEATH) ×1 IMPLANT
SHEATH PINNACLE ST 6F 45CM (SHEATH) ×1 IMPLANT
SHEATH PROBE COVER 6X72 (BAG) ×1 IMPLANT
STENT VIABAHN 6X100X120 (Permanent Stent) ×1 IMPLANT
STENT VIABAHN 6X250X120 (Permanent Stent) ×1 IMPLANT
SYR MEDRAD MARK 7 150ML (SYRINGE) ×1 IMPLANT
TUBING CONTRAST HIGH PRESS 72 (TUBING) ×1 IMPLANT
WIRE G V18X300CM (WIRE) ×1 IMPLANT
WIRE GUIDERIGHT .035X150 (WIRE) ×1 IMPLANT

## 2021-12-08 NOTE — Interval H&P Note (Signed)
History and Physical Interval Note:  12/08/2021 12:21 PM  Howard Stafford  has presented today for surgery, with the diagnosis of RLE Angio  BARD   Ischemic leg.  The various methods of treatment have been discussed with the patient and family. After consideration of risks, benefits and other options for treatment, the patient has consented to  Procedure(s): Lower Extremity Angiography (Right) as a surgical intervention.  The patient's history has been reviewed, patient examined, no change in status, stable for surgery.  I have reviewed the patient's chart and labs.  Questions were answered to the patient's satisfaction.     [name]

## 2021-12-08 NOTE — Op Note (Signed)
[facility]  Percutaneous Study/Intervention Procedural Note   Date of Surgery: 12/08/2021  Surgeon(s):Taichi Repka    Assistants:none  Pre-operative Diagnosis: PAD with rest pain right lower extremity, acute on chronic ischemia  Post-operative diagnosis:  Same  Procedure(s) Performed:             1.  Ultrasound guidance for vascular access left femoral artery             2.  Catheter placement into right common femoral artery from left femoral approach             3.  Aortogram and selective right lower extremity angiogram             4.  Catheter directed thrombolytic therapy with 6 mg of tPA to the right SFA             5.  Mechanical thrombectomy to the right SFA with the penumbra CAT 6 device  6.  Stent placement x2 to the right SFA with 6 mm diameter by 25 cm length Viabahn stent and 6 mm diameter by 10 cm length Viabahn stent             7.  StarClose closure device left femoral artery  EBL: 125 cc  Contrast: 40 cc  Fluoro Time: 4.7 minutes  Moderate Conscious Sedation Time: approximately 33 minutes using 2 mg of Versed and 50 mcg of Fentanyl              Indications:  Patient is a 76 y.o.male with acute on chronic right lower extremity rest pain. The patient has noninvasive study showing occlusion of his previous intervention on the right SFA. The patient is brought in for angiography for further evaluation and potential treatment.  Due to the limb threatening nature of the situation, angiogram was performed for attempted limb salvage. The patient is aware that if the procedure fails, amputation would be expected.  The patient also understands that even with successful revascularization, amputation may still be required due to the severity of the situation.  Risks and benefits are discussed and informed consent is obtained.   Procedure:  The patient was identified and appropriate procedural time out was performed.  The patient was then placed supine on  the table and prepped and draped in the usual sterile fashion. Moderate conscious sedation was administered during a face to face encounter with the patient throughout the procedure with my supervision of the RN administering medicines and monitoring the patient's vital signs, pulse oximetry, telemetry and mental status throughout from the start of the procedure until the patient was taken to the recovery room. Ultrasound was used to evaluate the left common femoral artery.  It was patent .  A digital ultrasound image was acquired.  A Seldinger needle was used to access the left common femoral artery under direct ultrasound guidance and a permanent image was performed.  A 0.035 J wire was advanced without resistance and a 5Fr sheath was placed.  Pigtail catheter was placed into the aorta and an AP aortogram was performed. This demonstrated normal renal arteries and normal aorta and iliac segments without significant stenosis including patency of the previously placed right external iliac stent. I then crossed the aortic bifurcation and advanced to the right femoral head. Selective right lower extremity angiogram was then performed. This demonstrated a fairly normal common femoral artery and profunda femoris artery with a near flush occlusion of the SFA a couple centimeters above the previously placed  stents.  The stents were occluded with reconstitution of the distal SFA and above-knee popliteal artery through collaterals at Hunter's canal.  There was then two-vessel runoff distally with the peroneal artery being the most dominant runoff and a small anterior tibial artery with a high takeoff also contributing to distal flow.. It was felt that it was in the patient's best interest to proceed with intervention after these images to avoid a second procedure and a larger amount of contrast and fluoroscopy based off of the findings from the initial angiogram. The patient was systemically heparinized and a 6 [place]  destination sheath was then placed over the Terumo Advantage wire. I then used a Kumpe catheter and the advantage wire to easily cross the fresh occlusion of the right SFA stents and confirm intraluminal flow in the popliteal artery placing a V18 wire.  Through the Kumpe catheter and instilled 6 mg of tPA in the right SFA.  After this dwelled, I used the penumbra CAT 6 device and perform mechanical thrombectomy in the right SFA with 2 passes pulling out significant amounts of thrombus.  Following this, there was mild to moderate thrombus within the previously placed stent with a greater than 70% stenosis in the SFA just proximal to the previously placed stent as well as a greater than 70% stenosis at the distal edge of the previously placed stent with associated thrombus.  I elected to primarily stent this.  A combination of a 6 mm diameter by 25 cm length Viabahn stent from Hunter's canal to the proximal SFA and a 6 mm diameter by 10 cm length Viabahn stent from the origin of the SFA bridging the initial stent by about 2 to 3 cm was performed.  This was not postdilated there was less than 20% residual stenosis with brisk flow distally. I elected to terminate the procedure. The sheath was removed and StarClose closure device was deployed in the left femoral artery with excellent hemostatic result. The patient was taken to the recovery room in stable condition having tolerated the procedure well.  Findings:               Aortogram:  This demonstrated normal renal arteries and normal aorta and iliac segments without significant stenosis including patency of the previously placed right external iliac stent.             Right lower Extremity: This demonstrated a fairly normal common femoral artery and profunda femoris artery with a near flush occlusion of the SFA a couple centimeters above the previously placed stents.  The stents were occluded with reconstitution of the distal SFA and above-knee popliteal artery  through collaterals at Hunter's canal.  There was then two-vessel runoff distally with the peroneal artery being the most dominant runoff and a small anterior tibial artery with a high takeoff also contributing to distal flow.   Disposition: Patient was taken to the recovery room in stable condition having tolerated the procedure well.  Complications: None  [name] 12/08/2021 4:14 PM   This note was created with Dragon Medical transcription system. Any errors in dictation are purely unintentional.

## 2021-12-08 NOTE — Progress Notes (Signed)
S: Patient presented today as an ultrasound only to evaluate for right lower extremity perfusion.  The patient was seen approximately 2 weeks ago and had normal ABIs in his right lower extremity.  He had biphasic waveforms throughout with patent stent.  At that time the patient requested to stop Plavix.  And he continue with aspirin.  The patient recently had a right lower extremity intervention and March.  He also continues to smoke.    O: Patient has a total occlusion of the superficial femoral artery with a stenosis noted in the proximal to mid SFA stent duplex.  A/P: Currently the patient has a total occlusion of his right SFA and is having significant claudication and beginning rest pain like symptoms.  Based on this it is recommended that the patient undergo right lower extremity angiogram.  I discussed the risk benefits and alternatives with the patient he is agreeable to proceed.

## 2021-12-08 NOTE — H&P (View-Only) (Signed)
S: Patient presented today as an ultrasound only to evaluate for right lower extremity perfusion.  The patient was seen approximately 2 weeks ago and had normal ABIs in his right lower extremity.  He had biphasic waveforms throughout with patent stent.  At that time the patient requested to stop Plavix.  And he continue with aspirin.  The patient recently had a right lower extremity intervention and March.  He also continues to smoke.    O: Patient has a total occlusion of the superficial femoral artery with a stenosis noted in the proximal to mid SFA stent duplex.  A/P: Currently the patient has a total occlusion of his right SFA and is having significant claudication and beginning rest pain like symptoms.  Based on this it is recommended that the patient undergo right lower extremity angiogram.  I discussed the risk benefits and alternatives with the patient he is agreeable to proceed.

## 2021-12-11 ENCOUNTER — Encounter: Payer: Self-pay | Admitting: Vascular Surgery

## 2022-01-04 ENCOUNTER — Other Ambulatory Visit (INDEPENDENT_AMBULATORY_CARE_PROVIDER_SITE_OTHER): Payer: Self-pay | Admitting: Vascular Surgery

## 2022-01-04 DIAGNOSIS — Z95820 Peripheral vascular angioplasty status with implants and grafts: Secondary | ICD-10-CM

## 2022-01-04 DIAGNOSIS — I70221 Atherosclerosis of native arteries of extremities with rest pain, right leg: Secondary | ICD-10-CM

## 2022-01-05 ENCOUNTER — Ambulatory Visit (INDEPENDENT_AMBULATORY_CARE_PROVIDER_SITE_OTHER): Payer: Medicare Other

## 2022-01-05 ENCOUNTER — Ambulatory Visit (INDEPENDENT_AMBULATORY_CARE_PROVIDER_SITE_OTHER): Payer: Medicare Other | Admitting: Nurse Practitioner

## 2022-01-05 ENCOUNTER — Encounter (INDEPENDENT_AMBULATORY_CARE_PROVIDER_SITE_OTHER): Payer: Self-pay | Admitting: Nurse Practitioner

## 2022-01-05 VITALS — BP 149/68 | HR 67 | Resp 18 | Ht 70.0 in | Wt 190.0 lb

## 2022-01-05 DIAGNOSIS — Z95820 Peripheral vascular angioplasty status with implants and grafts: Secondary | ICD-10-CM | POA: Diagnosis not present

## 2022-01-05 DIAGNOSIS — I70221 Atherosclerosis of native arteries of extremities with rest pain, right leg: Secondary | ICD-10-CM | POA: Diagnosis not present

## 2022-01-05 DIAGNOSIS — Z720 Tobacco use: Secondary | ICD-10-CM

## 2022-01-15 DIAGNOSIS — H34812 Central retinal vein occlusion, left eye, with macular edema: Secondary | ICD-10-CM | POA: Diagnosis not present

## 2022-01-25 ENCOUNTER — Encounter (INDEPENDENT_AMBULATORY_CARE_PROVIDER_SITE_OTHER): Payer: Self-pay | Admitting: Nurse Practitioner

## 2022-01-25 NOTE — Progress Notes (Signed)
Subjective:    Patient ID: Howard Stafford, male    DOB: [birth year], 76 y.o.   MRN: [id number] No chief complaint on file.   The patient returns to the office for followup and review status post angiogram with intervention on 12/08/2021.   Procedure: Procedure(s) Performed:             1.  Ultrasound guidance for vascular access left femoral artery             2.  Catheter placement into right common femoral artery from left femoral approach             3.  Aortogram and selective right lower extremity angiogram             4.  Catheter directed thrombolytic therapy with 6 mg of tPA to the right SFA             5.  Mechanical thrombectomy to the right SFA with the penumbra CAT 6 device             6.  Stent placement x2 to the right SFA with 6 mm diameter by 25 cm length Viabahn stent and 6 mm diameter by 10 cm length Viabahn stent             7.  StarClose closure device left femoral artery  The patient notes improvement in the lower extremity symptoms. No interval shortening of the patient's claudication distance or rest Stafford symptoms. No new ulcers or wounds have occurred since the last visit.  There have been no significant changes to the patient's overall health care.  No documented history of amaurosis fugax or recent TIA symptoms. There are no recent neurological changes noted. No documented history of DVT, PE or superficial thrombophlebitis. The patient denies recent episodes of angina or shortness of breath.   ABI's Rt=1.13 and Lt=0.70  (previous ABI's Rt=1.12 and Lt=0.66) Duplex US of the Right tibial arteries reveals triphasic/biphasic waveforms with biphasic waveforms in the left lower extremity    Review of Systems  Cardiovascular:  Positive for leg swelling.  All other systems reviewed and are negative.      Objective:   Physical Exam Vitals reviewed.  HENT:     Head: Normocephalic.  Cardiovascular:     Rate and Rhythm: Normal rate.     Pulses:           Dorsalis pedis pulses are 1+ on the right side and detected w/ Doppler on the left side.       Posterior tibial pulses are 1+ on the right side and detected w/ Doppler on the left side.  Pulmonary:     Effort: Pulmonary effort is normal.  Skin:    General: Skin is warm and dry.  Neurological:     Mental Status: He is alert and oriented to person, place, and time.  Psychiatric:        Mood and Affect: Mood normal.        Behavior: Behavior normal.        Thought Content: Thought content normal.        Judgment: Judgment normal.     BP (!) 149/68 (BP Location: Right Arm)   Pulse 67   Resp 18   Ht '5\' 10"'$  (1.778 m)   Wt 190 lb (86.2 kg)   BMI 27.26 kg/m   Past Medical History:  Diagnosis Date   Prostate cancer ([place])    Skin cancer face   Basal  cell carcinoma of nose    Social History   Socioeconomic History   Marital status: Married    Spouse name: Not on file   Number of children: Not on file   Years of education: Not on file   Highest education level: Not on file  Occupational History   Not on file  Tobacco Use   Smoking status: Every Day    Packs/day: 1.00    Years: 50.00    Total pack years: 50.00    Types: Cigarettes    Passive exposure: Current   Smokeless tobacco: Never  Substance and Sexual Activity   Alcohol use: Yes    Comment: Everynight [name]   Drug use: No   Sexual activity: Not on file  Other Topics Concern   Not on file  Social History Narrative   ** Merged History Encounter **       Social Determinants of Health   Financial Resource Strain: Not on file  Food Insecurity: Not on file  Transportation Needs: Not on file  Physical Activity: Not on file  Stress: Not on file  Social Connections: Not on file  Intimate Partner Violence: Not on file    Past Surgical History:  Procedure Laterality Date   BACK SURGERY     04/30/1984 - 04/29/1985   EYE SURGERY Left 04/08/2020   TRANSFER ADJACENT TISSUE EYELID, NOSE, EAR AND/OR LIP    LOWER EXTREMITY ANGIOGRAPHY Right 07/24/2021   Procedure: Lower Extremity Angiography;  Surgeon: Howard Huxley, MD;  Location: [facility];  Service: Cardiovascular;  Laterality: Right;   LOWER EXTREMITY ANGIOGRAPHY Right 12/08/2021   Procedure: Lower Extremity Angiography;  Surgeon: Howard Huxley, MD;  Location: [facility];  Service: Cardiovascular;  Laterality: Right;   MOHS SURGERY     04/30/2009 - 04/29/2010   skin cancer removal  2015   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 No pertinent family history.  No Known Allergies     Latest Ref Rng & Units 09/28/2021    3:22 PM 09/14/2021    3:11 PM 08/31/2021    2:43 PM  CBC  WBC 4.0 - 10.5 K/uL 5.9  5.2  5.2   Hemoglobin 13.0 - 17.0 g/dL 13.9  14.2  13.9   Hematocrit 39.0 - 52.0 % 39.3  40.2  39.6   Platelets 150 - 400 K/uL 178  181  159       CMP     Component Value Date/Time   NA 135 06/14/2021 1511   K 3.6 06/14/2021 1511   CL 104 06/14/2021 1511   CO2 22 06/14/2021 1511   GLUCOSE 130 (H) 06/14/2021 1511   BUN 23 12/08/2021 1254   CREATININE 0.93 12/08/2021 1254   CALCIUM 9.1 06/14/2021 1511   PROT 7.2 06/14/2021 1511   ALBUMIN 4.0 06/14/2021 1511   AST 29 06/14/2021 1511   ALT 32 06/14/2021 1511   ALKPHOS 86 06/14/2021 1511   BILITOT 0.8 06/14/2021 1511   GFRNONAA >60 12/08/2021 1254     VAS [place] ABI WITH/WO TBI  Result Date: 01/08/2022  LOWER EXTREMITY DOPPLER STUDY Patient Name:  Howard Stafford  Date of Exam:   01/05/2022 Medical Rec #: [id number]           Accession #:    [id number] Date of Birth: 10-08-1945            Patient Gender: M Patient Age:   81 years Exam Location:  [facility] Procedure:      VAS [place] ABI WITH/WO TBI Referring Phys: Howard Stafford --------------------------------------------------------------------------------  Indications: Peripheral artery disease. Other Factors: Patient reports right leg symptoms have resolved.  Vascular Interventions: 07/24/2021: Aortogram  and Selective Right Lower                         Extremity Angiogram. PTA of the Left EIA with 7 mm                         diameter by 6 cm length Lutonix drug coated angioplasty                         balloon. PTA of the Right Posterior Tibial Artery with 3                         mm diameter by 10 cm length angioplasty balloon.                         Mechanical Thrombectomy of the Right SFA with the [place]                         Rex device. PTA of the Right SFAwith 5 mm diameter by 30                         cm length drug coated drug coated angioplasty balloon.                         Viabahn Stent to the Right SFA with 6 mm diameter by 25                         cm length stent. PTA of the Right EIA Stent placement of                         the Right EIA for > 50% residual after angioplasty.                          12/08/2021: Aortogram and Selective Right Lower                         Extremity Angiogram. Catheter directed thrombolytic                         therapy with 6 mg of TPA to the Right SFA. Mecahnical                         thrombectomy to the Right SFA with the penumbra CAT 6                         device. Stent placement x2 to the Right SFA with 6 mm                         diameter by 25 cm length Viabahn stent and 6 mm diameter  by 10 cm length Viabahn stent. Comparison Study: 11/22/2021 Performing Technologist: Almira Coaster RVS  Examination Guidelines: A complete evaluation includes at minimum, Doppler waveform signals and systolic blood pressure reading at the level of bilateral brachial, anterior tibial, and posterior tibial arteries, when vessel segments are accessible. Bilateral testing is considered an integral part of a complete examination. Photoelectric Plethysmograph (PPG) waveforms and toe systolic pressure readings are included as required and additional duplex testing as needed. Limited examinations for reoccurring indications may be performed as  noted.  ABI Findings: +---------+------------------+-----+---------+--------+ Right    Rt Pressure (mmHg)IndexWaveform Comment  +---------+------------------+-----+---------+--------+ Brachial 151                                      +---------+------------------+-----+---------+--------+ ATA      174               1.13 triphasic         +---------+------------------+-----+---------+--------+ PTA      165               1.07 triphasic         +---------+------------------+-----+---------+--------+ Great Toe146               0.95 Normal            +---------+------------------+-----+---------+--------+ +---------+------------------+-----+--------+-------+ Left     Lt Pressure (mmHg)IndexWaveformComment +---------+------------------+-----+--------+-------+ Brachial 154                                    +---------+------------------+-----+--------+-------+ ATA      108               0.70 biphasic        +---------+------------------+-----+--------+-------+ PTA      101               0.66 biphasic        +---------+------------------+-----+--------+-------+ Great Toe90                0.58 Abnormal        +---------+------------------+-----+--------+-------+ +-------+-----------+-----------+------------+------------+ ABI/TBIToday's ABIToday's TBIPrevious ABIPrevious TBI +-------+-----------+-----------+------------+------------+ Right  1.13       .95        1.12        .74          +-------+-----------+-----------+------------+------------+ Left   .70        .58        .66         .33          +-------+-----------+-----------+------------+------------+ Bilateral TBIs appear increased compared to prior study on 11/22/2021. Right ABIs appear essentially unchanged compared to prior study on 11/22/2021. Left TBIs appear to be increased compared to prior study on 11/22/2021.  Summary: Right: Resting right ankle-brachial index is within normal range.  The right toe-brachial index is normal. Left: Resting left ankle-brachial index indicates moderate left lower extremity arterial disease. The left toe-brachial index is abnormal. *See table(s) above for measurements and observations.   Electronically signed by Howard Pain MD on 01/08/2022 at 8:20:03 AM.    Final        Assessment & Plan:   1. 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  The patient reports that the claudication symptoms and leg Stafford has improved.  The patient denies lifestyle limiting changes at this point in time.  No further invasive studies, angiography or surgery at this time The patient should continue walking and begin a more formal exercise program.  The patient was discharged on Eliquis.  Has concerns about continuing with the Eliquis.  After continuing the Eliquis for approximately another month until he has no other refills, he can transition back to Plavix.  Again patient is advised that smoking cessation is absolutely necessary or else reocclusion may occur.    Continued surveillance is indicated as atherosclerosis is likely to progress with time.    Patient should undergo noninvasive studies as ordered. The patient will follow up with me to review the studies.   - VAS [place] ABI WITH/WO TBI  2. Tobacco abuse Smoking cessation was discussed, 3-10 minutes spent on this topic specifically    Current Outpatient Medications on File Prior to Visit  Medication Sig Dispense Refill   apixaban (ELIQUIS) 5 MG TABS tablet Take 1 tablet (5 mg total) by mouth 2 (two) times daily. 60 tablet 2   aspirin EC 81 MG tablet Take 1 tablet (81 mg total) by mouth daily. 150 tablet 2   atorvastatin (LIPITOR) 10 MG tablet Take 1 tablet (10 mg total) by mouth daily. 30 tablet 11   darolutamide (NUBEQA) 300 MG tablet Take 600 mg by mouth 2 (two) times daily with a meal.      lisinopril (ZESTRIL) 10 MG tablet Take 10 mg by mouth daily.     Vibegron (GEMTESA) 75 MG TABS Take 75 mg by mouth daily. 30 tablet 0   No current facility-administered medications on file prior to visit.    There are no Patient Instructions on file for this visit. No follow-ups on file.   Kris Hartmann, NP

## 2022-01-31 ENCOUNTER — Telehealth: Payer: Self-pay | Admitting: Urology

## 2022-01-31 NOTE — Telephone Encounter (Signed)
S/W pt's wife and let her know we were canceling pt's appt and would call her back when Eligard was in.

## 2022-02-05 ENCOUNTER — Other Ambulatory Visit: Payer: Self-pay | Admitting: *Deleted

## 2022-02-05 MED ORDER — NUBEQA 300 MG PO TABS: 600.0000 mg | ORAL_TABLET | Freq: Two times a day (BID) | ORAL | 11 refills | Status: DC

## 2022-02-05 NOTE — Telephone Encounter (Signed)
Received fax asking for new script for Nubeqa '300mg'$  to be faxed to [phone].  Script faxed manually

## 2022-02-06 ENCOUNTER — Ambulatory Visit (INDEPENDENT_AMBULATORY_CARE_PROVIDER_SITE_OTHER): Payer: Medicare Other | Admitting: Urology

## 2022-02-06 ENCOUNTER — Encounter: Payer: Self-pay | Admitting: Urology

## 2022-02-06 ENCOUNTER — Ambulatory Visit: Payer: Medicare Other | Admitting: Urology

## 2022-02-06 VITALS — BP 139/82 | HR 76 | Ht 70.0 in | Wt 192.0 lb

## 2022-02-06 DIAGNOSIS — C61 Malignant neoplasm of prostate: Secondary | ICD-10-CM

## 2022-02-06 DIAGNOSIS — I70221 Atherosclerosis of native arteries of extremities with rest pain, right leg: Secondary | ICD-10-CM | POA: Diagnosis not present

## 2022-02-06 DIAGNOSIS — R350 Frequency of micturition: Secondary | ICD-10-CM | POA: Diagnosis not present

## 2022-02-06 MED ORDER — LEUPROLIDE ACETATE (6 MONTH) 45 MG ~~LOC~~ KIT
45.0000 mg | PACK | Freq: Once | SUBCUTANEOUS | Status: AC
  Administered 2022-02-06: 45 mg via SUBCUTANEOUS

## 2022-02-06 MED ORDER — LEUPROLIDE ACETATE (6 MONTH) 45 MG ~~LOC~~ KIT: 45.0000 mg | PACK | SUBCUTANEOUS | 1 refills | Status: DC

## 2022-02-06 NOTE — Progress Notes (Signed)
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    His PSA had risen to 92.1 on 06/02/2021. He underwent a prostate biopsy on 06/14/2021. Prostate biopsy was complicated by vasovagal episode he was admitted into the hospital for neurological and cardiovascular work-up which was all negative.    Surgical pathology was consistent with Gleason 4+3 involving three cores affecting up to 91%, Gleason 3+4 involving 6 cores affecting up to 100%, Gleason 3+3 involving 3 cores affecting up to 25%.    PSMA PET on 06/28/2021 scan visualized Intense radiotracer activity within LEFT RIGHT lobe prostate gland consistent with high-grade prostate adenocarcinoma. Multiple enlarged intensely radiotracer avid lymph nodes primarily in medial rectum of the pelvis. Single retroperitoneal periaortic metastatic node just above the bifurcation. This is the most superior metastatic node in the abdomen pelvis. No evidence of visceral metastasis.Solitary intensely radiotracer avid bone lesion in the T7 vertebral body. Differential would include benign activity associated with a Schmorl's node versus solitary skeletal metastasis. CT findings favor Schmorl's nodes.  S/p IMRT completed 10/12/21.  Currently on Eligard and [place]   He reports that he is doing pretty well.  He is back to work.  He gets tired by early afternoon and has to cut back.  He is also had another vascular procedure is now on Plavix.  That seems to be going well.  He does have some baseline frequency.  He tried Myrbetriq for a month and thinks that it made a modest difference but is concerned about the number of medications that he is on and would like to hold  off at this time and see how things go.  He denies any overt dysuria or gross hematuria.  PMH: Past Medical History:  Diagnosis Date   Prostate cancer [facility])    Skin cancer face   Basal cell carcinoma of nose    Surgical History: Past Surgical History:  Procedure Laterality Date   BACK SURGERY     04/30/1984 - 04/29/1985   EYE SURGERY Left 04/08/2020   TRANSFER ADJACENT TISSUE EYELID, NOSE, EAR AND/OR LIP   LOWER EXTREMITY ANGIOGRAPHY Right 07/24/2021   Procedure: Lower Extremity Angiography;  Surgeon: Algernon Huxley, MD;  Location: [facility];  Service: Cardiovascular;  Laterality: Right;   LOWER EXTREMITY ANGIOGRAPHY Right 12/08/2021   Procedure: Lower Extremity Angiography;  Surgeon: Algernon Huxley, MD;  Location: [facility];  Service: Cardiovascular;  Laterality: Right;   MOHS SURGERY     04/30/2009 - 04/29/2010   skin cancer removal  2015   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 If you have any questions, ask your nurse or doctor.          STOP taking these medications    apixaban 5 MG Tabs tablet Commonly known as: Eliquis Stopped by: Hollice Espy, MD   Gemtesa 75 MG Tabs Generic drug: Vibegron Stopped by: Hollice Espy, MD       TAKE these medications  aspirin EC 81 MG tablet Take 1 tablet (81 mg total) by mouth daily.   atorvastatin 10 MG tablet Commonly known as: Lipitor Take 1 tablet (10 mg total) by mouth daily.   clopidogrel 75 MG tablet Commonly known as: PLAVIX Take 75 mg by mouth daily.   leuprolide (6 Month) 45 MG injection Commonly known as: ELIGARD Inject 45 mg into the skin every 6 (six) months.   lisinopril 10 MG tablet Commonly known as: ZESTRIL Take 10 mg by mouth daily.   Nubeqa 300 MG tablet Generic drug: darolutamide Take 2 tablets (600 mg total) by mouth 2 (two) times  daily with a meal.        Allergies: No Known Allergies  Family History: No family history on file.  Social History:  reports that he has been smoking cigarettes. He has a 50.00 pack-year smoking history. He has been exposed to tobacco smoke. He has never used smokeless tobacco. He reports current alcohol use. He reports that he does not use drugs.   Physical Exam: BP 139/82   Pulse 76   Ht '5\' 10"'$  (1.778 m)   Wt 192 lb (87.1 kg)   BMI 27.55 kg/m   Constitutional:  Alert and oriented, No acute distress. HEENT: [place] AT, moist mucus membranes.  Trachea midline, no masses. Neurologic: Grossly intact, no focal deficits, moving all 4 extremities. Psychiatric: Normal mood and affect.  Laboratory Data: Lab Results  Component Value Date   WBC 5.9 09/28/2021   HGB 13.9 09/28/2021   HCT 39.3 09/28/2021   MCV 92.3 09/28/2021   PLT 178 09/28/2021    Lab Results  Component Value Date   CREATININE 0.93 12/08/2021     Lab Results  Component Value Date   HGBA1C 5.3 06/14/2021    Assessment & Plan:    1. 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 Olena Leatherwood office, can use today's value for reference  He was administered another 69-monthDepo of Eligard today, reviewed bone recommendations  We will continue the Nubeqa as well, the prescription was refilled today  We will consider cessation of these medications in 2 to 3 years depending on how he does in the setting of whole pelvic radiation with the thoughts of curative intent.  He is agreeable this plan.  Follow-up with Dr. CBaruch Goutyscheduled. - PSA - leuprolide (6 Month) (ELIGARD) injection 45 mg  2. Urinary frequency Improving slightly, will hold off on filling Myrbetriq at this time, will let u[place]ore[place]now if symptoms worsen or he would like to be prescribed this  medication   Return in about 6 months (around 08/08/2022) for  PSA (can be same day)/ Eligard.  AHollice Espy MD  [facility] [address] [place]villeBGuadalupe Guerra [place] [phone]

## 2022-02-06 NOTE — Telephone Encounter (Signed)
Script faxed.

## 2022-02-07 LAB — PSA: Prostate Specific Ag, Serum: <0.1 ng/mL (ref 0.0–4.0)

## 2022-02-07 NOTE — Progress Notes (Signed)
Eligard SubQ Injection   Due to Prostate Cancer patient is present today for a Eligard Injection.  Medication: Eligard 6 month Dose: 45 mg  Patient tolerated well, no complications were noted  Performed by: S. Metzli Pollick, CMA

## 2022-02-15 ENCOUNTER — Other Ambulatory Visit: Payer: Medicare Other

## 2022-02-22 ENCOUNTER — Encounter: Payer: Self-pay | Admitting: Radiation Oncology

## 2022-02-22 ENCOUNTER — Other Ambulatory Visit: Payer: Self-pay | Admitting: *Deleted

## 2022-02-22 ENCOUNTER — Ambulatory Visit
Admission: RE | Admit: 2022-02-22 | Discharge: 2022-02-22 | Disposition: A | Discharge status: Home / Self Care | Payer: Medicare Other | Source: Ambulatory Visit | Attending: Radiation Oncology | Admitting: Radiation Oncology

## 2022-02-22 VITALS — BP 132/77 | HR 75 | Temp 98.2°F | Resp 16 | Wt 194.0 lb

## 2022-02-22 DIAGNOSIS — C61 Malignant neoplasm of prostate: Secondary | ICD-10-CM

## 2022-02-22 DIAGNOSIS — Z191 Hormone sensitive malignancy status: Secondary | ICD-10-CM | POA: Diagnosis not present

## 2022-02-22 DIAGNOSIS — Z923 Personal history of irradiation: Secondary | ICD-10-CM | POA: Diagnosis not present

## 2022-02-22 NOTE — Progress Notes (Signed)
Radiation Oncology Follow up Note  Name: Howard Stafford   Date:   02/22/2022 MRN:  [id number] DOB: 1945-07-13    This 76 y.o. 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  REFERRING PROVIDER: TAlbina Billet MD  HPI: Patient is a 76year old male now out for months having completed IMRT radiation therapy for stage IVa adenocarcinoma the prostate.  Seen today in routine follow-up he is doing well specifically denies any increased lower urinary tract symptoms diarrhea or fatigue.  He is currently still on ADT therapy which we anticipate for 2 to 3 years.  His most recent PSA is less than 0.1..Marland Kitchen COMPLICATIONS OF TREATMENT: none  FOLLOW UP COMPLIANCE: keeps appointments   PHYSICAL EXAM:  BP 132/77 (BP Location: Right Arm, Patient Position: Sitting, Cuff Size: Normal)   Pulse 75   Temp 98.2 F (36.8 C) (Tympanic)   Resp 16   Wt 194 lb (88 kg)   BMI 27.84 kg/m  Well-developed well-nourished patient in NAD. HEENT reveals PERLA, EOMI, discs not visualized.  Oral cavity is clear. No oral mucosal lesions are identified. Neck is clear without evidence of cervical or supraclavicular adenopathy. Lungs are clear to A&P. Cardiac examination is essentially unremarkable with regular rate and rhythm without murmur rub or thrill. Abdomen is benign with no organomegaly or masses noted. Motor sensory and DTR levels are equal and symmetric in the upper and lower extremities. Cranial nerves II through XII are grossly intact. Proprioception is intact. No peripheral adenopathy or edema is identified. No motor or sensory levels are noted. Crude visual fields are within normal range.  RADIOLOGY RESULTS: No current films for review  PLAN: Present time patient is under excellent biochemical control of his prostate cancer continuing on ADT therapy.  He has a very low  side effect profile.  I am pleased with his overall progress.  I have asked to see him back in 6 months with a repeat PSA.  Patient knows to call with any concerns.  I would like to take this opportunity to thank you for allowing me to participate in the care of your patient..Noreene Filbert MD

## 2022-02-26 ENCOUNTER — Encounter (INDEPENDENT_AMBULATORY_CARE_PROVIDER_SITE_OTHER): Payer: Self-pay

## 2022-02-26 DIAGNOSIS — H34812 Central retinal vein occlusion, left eye, with macular edema: Secondary | ICD-10-CM | POA: Diagnosis not present

## 2022-03-05 ENCOUNTER — Telehealth: Payer: Self-pay | Admitting: Urology

## 2022-03-05 NOTE — Telephone Encounter (Signed)
Patient's wife called to request refill of Nubeqa 300 mg. She said that patient has run out of medication. Fax # [phone].

## 2022-03-06 NOTE — Telephone Encounter (Signed)
Dr. Erlene Quan sent in a 1-year prescription last month, can you please investigate this?

## 2022-03-07 NOTE — Telephone Encounter (Signed)
Nubeqa rx and demographics faxed.  Patient wife will try calling again.

## 2022-03-26 DIAGNOSIS — I10 Essential (primary) hypertension: Secondary | ICD-10-CM | POA: Diagnosis not present

## 2022-03-26 DIAGNOSIS — E785 Hyperlipidemia, unspecified: Secondary | ICD-10-CM | POA: Diagnosis not present

## 2022-04-02 DIAGNOSIS — I10 Essential (primary) hypertension: Secondary | ICD-10-CM | POA: Diagnosis not present

## 2022-04-02 DIAGNOSIS — D075 Carcinoma in situ of prostate: Secondary | ICD-10-CM | POA: Diagnosis not present

## 2022-04-02 DIAGNOSIS — I739 Peripheral vascular disease, unspecified: Secondary | ICD-10-CM | POA: Diagnosis not present

## 2022-04-17 DIAGNOSIS — H34812 Central retinal vein occlusion, left eye, with macular edema: Secondary | ICD-10-CM | POA: Diagnosis not present

## 2022-05-22 ENCOUNTER — Other Ambulatory Visit (INDEPENDENT_AMBULATORY_CARE_PROVIDER_SITE_OTHER): Payer: Self-pay | Admitting: Nurse Practitioner

## 2022-05-22 DIAGNOSIS — Z9889 Other specified postprocedural states: Secondary | ICD-10-CM

## 2022-05-25 ENCOUNTER — Ambulatory Visit (INDEPENDENT_AMBULATORY_CARE_PROVIDER_SITE_OTHER): Payer: Medicare Other | Admitting: Nurse Practitioner

## 2022-05-25 ENCOUNTER — Encounter (INDEPENDENT_AMBULATORY_CARE_PROVIDER_SITE_OTHER): Payer: Self-pay | Admitting: Nurse Practitioner

## 2022-05-25 ENCOUNTER — Ambulatory Visit (INDEPENDENT_AMBULATORY_CARE_PROVIDER_SITE_OTHER): Payer: Medicare Other

## 2022-05-25 VITALS — BP 130/75 | HR 85 | Resp 16 | Ht 70.0 in | Wt 194.0 lb

## 2022-05-25 DIAGNOSIS — Z9889 Other specified postprocedural states: Secondary | ICD-10-CM

## 2022-05-25 DIAGNOSIS — I739 Peripheral vascular disease, unspecified: Secondary | ICD-10-CM

## 2022-05-25 DIAGNOSIS — I70211 Atherosclerosis of native arteries of extremities with intermittent claudication, right leg: Secondary | ICD-10-CM | POA: Diagnosis not present

## 2022-05-25 DIAGNOSIS — Z720 Tobacco use: Secondary | ICD-10-CM | POA: Diagnosis not present

## 2022-05-25 MED ORDER — CLOPIDOGREL BISULFATE 75 MG PO TABS: 75.0000 mg | ORAL_TABLET | Freq: Every day | ORAL | 3 refills | Status: DC

## 2022-05-26 ENCOUNTER — Encounter (INDEPENDENT_AMBULATORY_CARE_PROVIDER_SITE_OTHER): Payer: Self-pay | Admitting: Nurse Practitioner

## 2022-05-26 NOTE — Progress Notes (Signed)
Subjective:    Patient ID: Howard Stafford, male    DOB: 05-Nov-1945, 77 y.o.   MRN: [id number] Chief Complaint  Patient presents with   Follow-up    ultrasound     The patient returns to the office for followup and review of the noninvasive studies.   There have been no interval changes in lower extremity symptoms. No interval shortening of the patient's claudication distance or development of rest pain symptoms. No new ulcers or wounds have occurred since the last visit.  There have been no significant changes to the patient's overall health care.  The patient denies amaurosis fugax or recent TIA symptoms. There are no documented recent neurological changes noted. There is no history of DVT, PE or superficial thrombophlebitis. The patient denies recent episodes of angina or shortness of breath.    ABI's Rt=1.02 and Lt=0.69 (previous ABI's Rt=1.13 and Lt=0.70) Duplex US of the right lower extremity tibial vessels reveals biphasic waveforms with monophasic in the left.    Review of Systems  All other systems reviewed and are negative.      Objective:   Physical Exam Vitals reviewed.  HENT:     Head: Normocephalic.  Cardiovascular:     Rate and Rhythm: Normal rate.     Pulses:          Dorsalis pedis pulses are 1+ on the right side and detected w/ Doppler on the left side.       Posterior tibial pulses are 1+ on the right side and detected w/ Doppler on the left side.  Pulmonary:     Effort: Pulmonary effort is normal.  Skin:    General: Skin is warm and dry.  Neurological:     Mental Status: He is alert and oriented to person, place, and time.  Psychiatric:        Mood and Affect: Mood normal.        Behavior: Behavior normal.        Thought Content: Thought content normal.        Judgment: Judgment normal.     BP 130/75 (BP Location: Right Arm)   Pulse 85   Resp 16   Ht '5\' 10"'$  (1.778 m)   Wt 194 lb (88 kg)   BMI 27.84 kg/m   Past Medical History:   Diagnosis Date   Prostate cancer ([place])    Skin cancer face   Basal cell carcinoma of nose    Social History   Socioeconomic History   Marital status: Married    Spouse name: Not on file   Number of children: Not on file   Years of education: Not on file   Highest education level: Not on file  Occupational History   Not on file  Tobacco Use   Smoking status: Every Day    Packs/day: 1.00    Years: 50.00    Total pack years: 50.00    Types: Cigarettes    Passive exposure: Current   Smokeless tobacco: Never  Substance and Sexual Activity   Alcohol use: Yes    Comment: Everynight [name]   Drug use: No   Sexual activity: Not on file  Other Topics Concern   Not on file  Social History Narrative   ** Merged History Encounter **       Social Determinants of Health   Financial Resource Strain: Not on file  Food Insecurity: Not on file  Transportation Needs: Not on file  Physical Activity: Not on  file  Stress: Not on file  Social Connections: Not on file  Intimate Partner Violence: Not on file    Past Surgical History:  Procedure Laterality Date   BACK SURGERY     04/30/1984 - 04/29/1985   EYE SURGERY Left 04/08/2020   TRANSFER ADJACENT TISSUE EYELID, NOSE, EAR AND/OR LIP   LOWER EXTREMITY ANGIOGRAPHY Right 07/24/2021   Procedure: Lower Extremity Angiography;  Surgeon: Algernon Huxley, MD;  Location: [facility];  Service: Cardiovascular;  Laterality: Right;   LOWER EXTREMITY ANGIOGRAPHY Right 12/08/2021   Procedure: Lower Extremity Angiography;  Surgeon: Algernon Huxley, MD;  Location: [facility];  Service: Cardiovascular;  Laterality: Right;   MOHS SURGERY     04/30/2009 - 04/29/2010   skin cancer removal  2015   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 No pertinent family history.  No Known Allergies     Latest Ref Rng & Units 09/28/2021    3:22 PM 09/14/2021    3:11 PM 08/31/2021    2:43 PM  CBC  WBC 4.0 - 10.5 K/uL 5.9  5.2  5.2    Hemoglobin 13.0 - 17.0 g/dL 13.9  14.2  13.9   Hematocrit 39.0 - 52.0 % 39.3  40.2  39.6   Platelets 150 - 400 K/uL 178  181  159       CMP     Component Value Date/Time   NA 135 06/14/2021 1511   K 3.6 06/14/2021 1511   CL 104 06/14/2021 1511   CO2 22 06/14/2021 1511   GLUCOSE 130 (H) 06/14/2021 1511   BUN 23 12/08/2021 1254   CREATININE 0.93 12/08/2021 1254   CALCIUM 9.1 06/14/2021 1511   PROT 7.2 06/14/2021 1511   ALBUMIN 4.0 06/14/2021 1511   AST 29 06/14/2021 1511   ALT 32 06/14/2021 1511   ALKPHOS 86 06/14/2021 1511   BILITOT 0.8 06/14/2021 1511   GFRNONAA >60 12/08/2021 1254     VAS [place] ABI WITH/WO TBI  Result Date: 01/08/2022  LOWER EXTREMITY DOPPLER STUDY Patient Name:  Howard Stafford  Date of Exam:   01/05/2022 Medical Rec #: [id number]           Accession #:    [id number] Date of Birth: 11-07-1945            Patient Gender: M Patient Age:   10 years Exam Location:  [facility] Procedure:      VAS [place] ABI WITH/WO TBI Referring Phys: [name] --------------------------------------------------------------------------------  Indications: Peripheral artery disease. Other Factors: Patient reports right leg symptoms have resolved.  Vascular Interventions: 07/24/2021: Aortogram and Selective Right Lower                         Extremity Angiogram. PTA of the Left EIA with 7 mm                         diameter by 6 cm length Lutonix drug coated angioplasty                         balloon. PTA of the Right Posterior Tibial Artery with 3                         mm diameter by 10 cm length angioplasty balloon.  Mechanical Thrombectomy of the Right SFA with the [place]                         Rex device. PTA of the Right SFAwith 5 mm diameter by 30                         cm length drug coated drug coated angioplasty balloon.                         Viabahn Stent to the Right SFA with 6 mm diameter by 25                         cm length stent. PTA of the  Right EIA Stent placement of                         the Right EIA for > 50% residual after angioplasty.                          12/08/2021: Aortogram and Selective Right Lower                         Extremity Angiogram. Catheter directed thrombolytic                         therapy with 6 mg of TPA to the Right SFA. Mecahnical                         thrombectomy to the Right SFA with the penumbra CAT 6                         device. Stent placement x2 to the Right SFA with 6 mm                         diameter by 25 cm length Viabahn stent and 6 mm diameter                         by 10 cm length Viabahn stent. Comparison Study: 11/22/2021 Performing Technologist: Almira Coaster RVS  Examination Guidelines: A complete evaluation includes at minimum, Doppler waveform signals and systolic blood pressure reading at the level of bilateral brachial, anterior tibial, and posterior tibial arteries, when vessel segments are accessible. Bilateral testing is considered an integral part of a complete examination. Photoelectric Plethysmograph (PPG) waveforms and toe systolic pressure readings are included as required and additional duplex testing as needed. Limited examinations for reoccurring indications may be performed as noted.  ABI Findings: +---------+------------------+-----+---------+--------+ Right    Rt Pressure (mmHg)IndexWaveform Comment  +---------+------------------+-----+---------+--------+ Brachial 151                                      +---------+------------------+-----+---------+--------+ ATA      174               1.13 triphasic         +---------+------------------+-----+---------+--------+ PTA      165  1.07 triphasic         +---------+------------------+-----+---------+--------+ Great Toe146               0.95 Normal            +---------+------------------+-----+---------+--------+ +---------+------------------+-----+--------+-------+ Left     Lt  Pressure (mmHg)IndexWaveformComment +---------+------------------+-----+--------+-------+ Brachial 154                                    +---------+------------------+-----+--------+-------+ ATA      108               0.70 biphasic        +---------+------------------+-----+--------+-------+ PTA      101               0.66 biphasic        +---------+------------------+-----+--------+-------+ Great Toe90                0.58 Abnormal        +---------+------------------+-----+--------+-------+ +-------+-----------+-----------+------------+------------+ ABI/TBIToday's ABIToday's TBIPrevious ABIPrevious TBI +-------+-----------+-----------+------------+------------+ Right  1.13       .95        1.12        .74          +-------+-----------+-----------+------------+------------+ Left   .70        .58        .66         .33          +-------+-----------+-----------+------------+------------+ Bilateral TBIs appear increased compared to prior study on 11/22/2021. Right ABIs appear essentially unchanged compared to prior study on 11/22/2021. Left TBIs appear to be increased compared to prior study on 11/22/2021.  Summary: Right: Resting right ankle-brachial index is within normal range. The right toe-brachial index is normal. Left: Resting left ankle-brachial index indicates moderate left lower extremity arterial disease. The left toe-brachial index is abnormal. *See table(s) above for measurements and observations.   Electronically signed by [name] MD on 01/08/2022 at 8:20:03 AM.    Final        Assessment & Plan:   1. Atherosclerosis of native artery of right lower extremity with rest pain ([place])  Recommend:  The patient has evidence of atherosclerosis of the lower extremities with claudication.  The patient does not voice lifestyle limiting changes at this point in time.  Noninvasive studies do not suggest clinically significant change.  No invasive studies,  angiography or surgery at this time The patient should continue walking and begin a more formal exercise program.  The patient should continue antiplatelet therapy and aggressive treatment of the lipid abnormalities  No changes in the patient's medications at this time  Continued surveillance is indicated as atherosclerosis is likely to progress with time.    The patient will continue follow up with noninvasive studies as ordered.  - VAS [place] ABI WITH/WO TBI  2. Tobacco abuse Smoking cessation was discussed, 3-10 minutes spent on this topic specifically    Current Outpatient Medications on File Prior to Visit  Medication Sig Dispense Refill   aspirin EC 81 MG tablet Take 1 tablet (81 mg total) by mouth daily. 150 tablet 2   atorvastatin (LIPITOR) 10 MG tablet Take 1 tablet (10 mg total) by mouth daily. 30 tablet 11   darolutamide (NUBEQA) 300 MG tablet Take 2 tablets (600 mg total) by mouth 2 (two) times daily with a meal. 120 tablet 11   lisinopril (ZESTRIL) 10 MG tablet Take 10  mg by mouth daily.     leuprolide, 6 Month, (ELIGARD) 45 MG injection Inject 45 mg into the skin every 6 (six) months. (Patient not taking: Reported on 05/25/2022)     No current facility-administered medications on file prior to visit.    There are no Patient Instructions on file for this visit. No follow-ups on file.   Kris Hartmann, NP

## 2022-05-28 LAB — VAS US ABI WITH/WO TBI
Left ABI: 0.69
Right ABI: 1.02

## 2022-07-26 ENCOUNTER — Ambulatory Visit: Payer: Medicare Other | Admitting: Radiation Oncology

## 2022-08-03 ENCOUNTER — Telehealth: Payer: Self-pay

## 2022-08-03 NOTE — Telephone Encounter (Signed)
No PA required for Eligard.  

## 2022-08-03 NOTE — Telephone Encounter (Signed)
-----   Message from [facility] [place] sent at 08/02/2022  4:01 PM EDT ----- Regarding: Eligard Hi,  Patient has app for Eligard on 08/08/22  Thank you Anastasiya

## 2022-08-08 ENCOUNTER — Ambulatory Visit (INDEPENDENT_AMBULATORY_CARE_PROVIDER_SITE_OTHER): Payer: Medicare Other | Admitting: Urology

## 2022-08-08 VITALS — BP 143/72 | HR 74 | Ht 70.0 in | Wt 194.0 lb

## 2022-08-08 DIAGNOSIS — C61 Malignant neoplasm of prostate: Secondary | ICD-10-CM | POA: Diagnosis not present

## 2022-08-08 MED ORDER — LEUPROLIDE ACETATE (6 MONTH) 45 MG ~~LOC~~ KIT
45.0000 mg | PACK | Freq: Once | SUBCUTANEOUS | Status: AC
Start: 2022-08-08 — End: 2022-08-08
  Administered 2022-08-08: 45 mg via SUBCUTANEOUS

## 2022-08-08 NOTE — Progress Notes (Signed)
Howard Stafford presents for an office/procedure visit. BP today is 150/73 . He/She is complaint/noncompliant with BP medication. Greater than 140/90. Provider  notified. Pt advised to keep an eye on BP . Pt voiced understanding.

## 2022-08-08 NOTE — Patient Instructions (Signed)
Discussed importance of bone health on ADT, recommend 1000-1200 mg daily calcium suppliment and 800-1000 IU vit D daily.  Also encouraged weight being exercises and cardiovascular health.  

## 2022-08-08 NOTE — Progress Notes (Addendum)
Eligard SubQ Injection   Due to Prostate Cancer patient is present today for a Eligard Injection.  Medication: Eligard 6 month Dose: 45 mg  Location: left LUOQ Lot: [id number] Exp: 08/29/2023  Patient tolerated well, no complications were noted  Performed by: Randa Lynn, RMA  Per Dr. Apolinar Junes patient is to continue therapy until 2025-2026, potentially longer  . Patient's next follow up was scheduled for February 12 2023. This appointment was scheduled using wheel and given to patient today along with reminder continue on Vitamin D 800-1000iu and Calcium 1000-1200mg  daily while on Androgen Deprivation Therapy.  PA approval dates:

## 2022-08-08 NOTE — Progress Notes (Signed)
Marcelle Overlie Plume,acting as a scribe for Vanna Scotland, MD.,have documented all relevant documentation on the behalf of Vanna Scotland, MD,as directed by  Vanna Scotland, MD while in the presence of Vanna Scotland, MD.  08/08/2022 11:47 AM   Howard Stafford 1946/04/24 [id number]  Referring provider: Jaclyn Shaggy, MD [address]   [place],  [place] [address]  Chief Complaint  Patient presents with   Follow-up    HPI: 77 year-old male who returns today for a 6 month follow up. 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 He is due for his Eligard today.   His PSA had risen to 92.1 on 06/02/2021. He underwent a prostate biopsy on 06/14/2021. Prostate biopsy was complicated by vasovagal episode he was admitted into the hospital for neurological and cardiovascular work-up which was all negative.    Surgical pathology was consistent with Gleason 4+3 involving three cores affecting up to 91%, Gleason 3+4 involving 6 cores affecting up to 100%, Gleason 3+3 involving 3 cores affecting up to 25%.    PSMA PET on 06/28/2021 scan visualized Intense radiotracer activity within LEFT RIGHT lobe prostate gland consistent with high-grade prostate adenocarcinoma. Multiple enlarged intensely radiotracer avid lymph nodes primarily in medial rectum of the pelvis. Single retroperitoneal periaortic metastatic node just above the bifurcation. This is the most superior metastatic node in the abdomen pelvis. No evidence of visceral metastasis.Solitary intensely radiotracer avid bone lesion in the T7 vertebral body. Differential would include benign activity associated with a Schmorl's node versus solitary skeletal metastasis. CT findings favor Schmorl's nodes.   S/p IMRT completed 10/12/21.   Currently on Eligard and [place]  His most recent PSA is pending as it was taken today.   He reports an appointment with Dr. Rushie Chestnut next Wednesday.   Today, he reports that he  has continued his prescribed medications without any issues but reported not taking calcium and vitamin D supplements as recommended. He also reported genital sensitivity but no significant pain or swelling. He reports nocturia 2-3 times a night.   He take 20 mg Atorvastatin for cholesterol management that is prescribed by Dr. Arlana Pouch. He is scheduled for a follow up in May.    PMH: Past Medical History:  Diagnosis Date   Prostate cancer [facility])    Skin cancer face   Basal cell carcinoma of nose    Surgical History: Past Surgical History:  Procedure Laterality Date   BACK SURGERY     04/30/1984 - 04/29/1985   EYE SURGERY Left 04/08/2020   TRANSFER ADJACENT TISSUE EYELID, NOSE, EAR AND/OR LIP   LOWER EXTREMITY ANGIOGRAPHY Right 07/24/2021   Procedure: Lower Extremity Angiography;  Surgeon: Annice Needy, MD;  Location: [facility];  Service: Cardiovascular;  Laterality: Right;   LOWER EXTREMITY ANGIOGRAPHY Right 12/08/2021   Procedure: Lower Extremity Angiography;  Surgeon: Annice Needy, MD;  Location: [facility];  Service: Cardiovascular;  Laterality: Right;   MOHS SURGERY     04/30/2009 - 04/29/2010   skin cancer removal  2015   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 If you have any questions, ask your nurse or doctor.          aspirin EC 81 MG tablet Take 1 tablet (81  mg total) by mouth daily.   atorvastatin 10 MG tablet Commonly known as: Lipitor Take 1 tablet (10 mg total) by mouth daily.   clopidogrel 75 MG tablet Commonly known as: PLAVIX Take 1 tablet (75 mg total) by mouth daily.   leuprolide (6 Month) 45 MG injection Commonly known as: ELIGARD Inject 45 mg into the skin every 6 (six) months.   lisinopril 10 MG tablet Commonly known as: ZESTRIL Take 10 mg by mouth daily.   Nubeqa 300 MG tablet Generic drug:  darolutamide Take 2 tablets (600 mg total) by mouth 2 (two) times daily with a meal.        Social History:  reports that he has been smoking cigarettes. He has a 50.00 pack-year smoking history. He has been exposed to tobacco smoke. He has never used smokeless tobacco. He reports current alcohol use. He reports that he does not use drugs.   Physical Exam: BP (!) 143/72   Pulse 74   Ht 5\' 10"  (1.778 m)   Wt 194 lb (88 kg)   BMI 27.84 kg/m   Constitutional:  Alert and oriented, No acute distress. HEENT: [place] AT, moist mucus membranes.  Trachea midline, no masses. Neurologic: Grossly intact, no focal deficits, moving all 4 extremities. Psychiatric: Normal mood and affect.  Assessment & Plan:    1. Prostate cancer - We will plan on continuing medications for 2-3 years, potentially longer - He had whole pelvic radiation with curative intent - We discussed the importance of having PSA labs performed several days prior to appointment for better treatment planning - He was advised to start calcium and vitamin D supplements to prevent bone thinning associated with ADT. A recommendation for a men's one-a-day vitamin containing calcium and vitamin D was made. - Eligard injection today (6 mo depo) - Continue Eligard and Nubeqa  2. Genital sensitivity - Likely related to hormone therapy. Advised to wear tighter fitting underwear for extra support. No immediate concern.   Return in about 6 months (around 02/07/2023) for Eligard injection. PSA a few days prior.  I have reviewed the above documentation for accuracy and completeness, and I agree with the above.   Vanna Scotland, MD   [facility] [address], [address] [place], [place] [address] [phone]

## 2022-08-09 LAB — PSA: Prostate Specific Ag, Serum: <0.1 ng/mL (ref 0.0–4.0)

## 2022-08-13 ENCOUNTER — Other Ambulatory Visit: Payer: Medicare Other

## 2022-08-15 ENCOUNTER — Encounter: Payer: Self-pay | Admitting: Radiation Oncology

## 2022-08-15 ENCOUNTER — Ambulatory Visit
Admission: RE | Admit: 2022-08-15 | Discharge: 2022-08-15 | Disposition: A | Discharge status: Home / Self Care | Payer: Medicare Other | Source: Ambulatory Visit | Attending: Radiation Oncology | Admitting: Radiation Oncology

## 2022-08-15 VITALS — BP 130/73 | HR 67 | Temp 96.6°F | Resp 16 | Ht 70.0 in | Wt 193.4 lb

## 2022-08-15 DIAGNOSIS — Z923 Personal history of irradiation: Secondary | ICD-10-CM | POA: Diagnosis not present

## 2022-08-15 DIAGNOSIS — C775 Secondary and unspecified malignant neoplasm of intrapelvic lymph nodes: Secondary | ICD-10-CM | POA: Diagnosis not present

## 2022-08-15 DIAGNOSIS — C61 Malignant neoplasm of prostate: Secondary | ICD-10-CM | POA: Diagnosis not present

## 2022-08-15 NOTE — Progress Notes (Signed)
Radiation Oncology Follow up Note  Name: Howard Stafford   Date:   08/15/2022 MRN:  [id number] DOB: Oct 11, 1945    This 77 y.o. 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  REFERRING PROVIDER: Jaclyn Shaggy, MD  HPI: Patient is a 77 year old male now out 10 months having completed radiation therapy to his prostate and pelvic nodes for a Gleason 7 adenocarcinoma presenting with a PSA in the 90 range.  Seen today in routine follow-up he is doing well.  He specifically denies any increased lower urinary tract symptoms diarrhea or fatigue.  His most recent PSA is less than 0.1.  He is currently on Eligard therapy under urology's direction.  COMPLICATIONS OF TREATMENT: none  FOLLOW UP COMPLIANCE: keeps appointments   PHYSICAL EXAM:  BP 130/73   Pulse 67   Temp (!) 96.6 F (35.9 C)   Resp 16   Ht  (1.778 m)   Wt 193 lb 6.4 oz (87.7 kg)   BMI 27.75 kg/m  Well-developed well-nourished patient in NAD. HEENT reveals PERLA, EOMI, discs not visualized.  Oral cavity is clear. No oral mucosal lesions are identified. Neck is clear without evidence of cervical or supraclavicular adenopathy. Lungs are clear to A&P. Cardiac examination is essentially unremarkable with regular rate and rhythm without murmur rub or thrill. Abdomen is benign with no organomegaly or masses noted. Motor sensory and DTR levels are equal and symmetric in the upper and lower extremities. Cranial nerves II through XII are grossly intact. Proprioception is intact. No peripheral adenopathy or edema is identified. No motor or sensory levels are noted. Crude visual fields are within normal range.  RADIOLOGY RESULTS: No current films for review  PLAN: Present time patient is doing well under excellent biochemical control of his prostate cancer.  He continues on Eligard treatment through  urology.  I have asked to see him back in 6 months for follow-up.  Patient is to call with any concerns.  I would like to take this opportunity to thank you for allowing me to participate in the care of your patient.Carmina Miller, MD

## 2022-11-26 ENCOUNTER — Other Ambulatory Visit (INDEPENDENT_AMBULATORY_CARE_PROVIDER_SITE_OTHER): Payer: Self-pay | Admitting: Nurse Practitioner

## 2022-11-26 DIAGNOSIS — I739 Peripheral vascular disease, unspecified: Secondary | ICD-10-CM

## 2022-11-27 ENCOUNTER — Ambulatory Visit (INDEPENDENT_AMBULATORY_CARE_PROVIDER_SITE_OTHER): Payer: Medicare Other

## 2022-11-27 ENCOUNTER — Ambulatory Visit (INDEPENDENT_AMBULATORY_CARE_PROVIDER_SITE_OTHER): Payer: Medicare Other | Admitting: Vascular Surgery

## 2022-11-27 ENCOUNTER — Encounter (INDEPENDENT_AMBULATORY_CARE_PROVIDER_SITE_OTHER): Payer: Self-pay | Admitting: Vascular Surgery

## 2022-11-27 VITALS — BP 138/76 | HR 69 | Resp 18 | Ht 70.0 in | Wt 191.9 lb

## 2022-11-27 DIAGNOSIS — I739 Peripheral vascular disease, unspecified: Secondary | ICD-10-CM

## 2022-11-27 DIAGNOSIS — Z720 Tobacco use: Secondary | ICD-10-CM | POA: Diagnosis not present

## 2022-11-27 DIAGNOSIS — Z9889 Other specified postprocedural states: Secondary | ICD-10-CM

## 2022-11-27 DIAGNOSIS — I70211 Atherosclerosis of native arteries of extremities with intermittent claudication, right leg: Secondary | ICD-10-CM | POA: Diagnosis not present

## 2022-11-27 NOTE — Progress Notes (Unsigned)
MRN : [id number]  Howard Stafford is a 77 y.o. (07/26/1945) male who presents with chief complaint of No chief complaint on file. Marland Kitchen  History of Present Illness: Patient returns today in follow up of ***  Current Outpatient Medications  Medication Sig Dispense Refill   aspirin EC 81 MG tablet Take 1 tablet (81 mg total) by mouth daily. 150 tablet 2   atorvastatin (LIPITOR) 10 MG tablet Take 1 tablet (10 mg total) by mouth daily. 30 tablet 11   clopidogrel (PLAVIX) 75 MG tablet Take 1 tablet (75 mg total) by mouth daily. 90 tablet 3   darolutamide (NUBEQA) 300 MG tablet Take 2 tablets (600 mg total) by mouth 2 (two) times daily with a meal. 120 tablet 11   leuprolide, 6 Month, (ELIGARD) 45 MG injection Inject 45 mg into the skin every 6 (six) months.     lisinopril (ZESTRIL) 10 MG tablet Take 10 mg by mouth daily.     No current facility-administered medications for this visit.    Past Medical History:  Diagnosis Date   Prostate cancer [facility])    Skin cancer face   Basal cell carcinoma of nose    Past Surgical History:  Procedure Laterality Date   BACK SURGERY     04/30/1984 - 04/29/1985   EYE SURGERY Left 04/08/2020   TRANSFER ADJACENT TISSUE EYELID, NOSE, EAR AND/OR LIP   LOWER EXTREMITY ANGIOGRAPHY Right 07/24/2021   Procedure: Lower Extremity Angiography;  Surgeon: Annice Needy, MD;  Location: [facility] INVASIVE CV LAB;  Service: Cardiovascular;  Laterality: Right;   LOWER EXTREMITY ANGIOGRAPHY Right 12/08/2021   Procedure: Lower Extremity Angiography;  Surgeon: Annice Needy, MD;  Location: [facility] INVASIVE CV LAB;  Service: Cardiovascular;  Laterality: Right;   MOHS SURGERY     04/30/2009 - 04/29/2010   skin cancer removal  2015   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 ***  No Known Allergies   REVIEW OF SYSTEMS (Negative unless checked)  Constitutional: [] Weight loss  [] Fever  [] Chills Cardiac: [] Chest pain   [] Chest pressure   [] Palpitations   [] Shortness of breath when laying flat   [] Shortness of breath at rest   [] Shortness of breath with exertion. Vascular:  [] Pain in legs with walking   [] Pain in legs at rest   [] Pain in legs when laying flat   [] Claudication   [] Pain in feet when walking  [] Pain in feet at rest  [] Pain in feet when laying flat   [] History of DVT   [] Phlebitis   [] Swelling in legs   [] Varicose veins   [] Non-healing ulcers Pulmonary:   [] Uses home oxygen   [] Productive cough   [] Hemoptysis   [] Wheeze  [] COPD   [] Asthma Neurologic:  [] Dizziness  [] Blackouts   [] Seizures   [] History of stroke   [] History of TIA  [] Aphasia   [] Temporary blindness   [] Dysphagia   [] Weakness or numbness in arms   [] Weakness or numbness in legs Musculoskeletal:  [] Arthritis   [] Joint swelling   []   Joint pain   [] Low back pain Hematologic:  [] Easy bruising  [] Easy bleeding   [] Hypercoagulable state   [] Anemic   Gastrointestinal:  [] Blood in stool   [] Vomiting blood  [] Gastroesophageal reflux/heartburn   [] Abdominal pain Genitourinary:  [] Chronic kidney disease   [] Difficult urination  [] Frequent urination  [] Burning with urination   [] Hematuria Skin:  [] Rashes   [] Ulcers   [] Wounds Psychological:  [] History of anxiety   []  History of major depression.  Physical Examination  There were no vitals taken for this visit. Gen:  WD/WN, NAD Head: [place]/AT, No temporalis wasting. Ear/Nose/Throat: Hearing grossly intact, nares w/o erythema or drainage Eyes: Conjunctiva clear. Sclera non-icteric Neck: Supple.  Trachea midline Pulmonary:  Good air movement, no use of accessory muscles.  Cardiac: RRR, no JVD Vascular:  *** Vessel Right Left  Radial Palpable Palpable                          PT Palpable Palpable  DP Palpable Palpable   Gastrointestinal: soft, non-tender/non-distended. No guarding/reflex.  Musculoskeletal: M/S 5/5 throughout.  No deformity or atrophy. *** edema. Neurologic: Sensation grossly intact in extremities.  Symmetrical.  Speech is fluent.  Psychiatric: Judgment intact, Mood & affect appropriate for pt's clinical situation. Dermatologic: No rashes or ulcers noted.  No cellulitis or open wounds.      Labs No results found for this or any previous visit (from the past 2160 hour(s)).  Radiology No results found.  Assessment/Plan  No problem-specific Assessment & Plan notes found for this encounter.    Festus Barren, MD  11/27/2022 9:22 AM    This note was created with Dragon medical transcription system.  Any errors from dictation are purely unintentional

## 2022-11-28 NOTE — Assessment & Plan Note (Signed)
Represents an atherosclerotic risk factor and cessation would be of benefit.

## 2022-11-28 NOTE — Assessment & Plan Note (Signed)
Duplex today shows a left SFA occlusion and patency of his right lower extremity interventions with no recurrent stenosis.  ABIs are 1.12 on the right and 0.76 on the left. Continue current medical regimen.  Recheck in 6 months with noninvasive studies.

## 2023-02-07 ENCOUNTER — Other Ambulatory Visit: Payer: Medicare Other

## 2023-02-07 DIAGNOSIS — C61 Malignant neoplasm of prostate: Secondary | ICD-10-CM

## 2023-02-08 LAB — PSA: Prostate Specific Ag, Serum: <0.1 ng/mL (ref 0.0–4.0)

## 2023-02-12 ENCOUNTER — Ambulatory Visit (INDEPENDENT_AMBULATORY_CARE_PROVIDER_SITE_OTHER): Payer: Medicare Other | Admitting: Urology

## 2023-02-12 VITALS — BP 156/85 | HR 68 | Ht 70.0 in | Wt 192.4 lb

## 2023-02-12 DIAGNOSIS — C61 Malignant neoplasm of prostate: Secondary | ICD-10-CM

## 2023-02-12 DIAGNOSIS — K5900 Constipation, unspecified: Secondary | ICD-10-CM

## 2023-02-12 MED ORDER — LEUPROLIDE ACETATE (6 MONTH) 45 MG ~~LOC~~ KIT
45.0000 mg | PACK | Freq: Once | SUBCUTANEOUS | Status: AC
Start: 2023-02-12 — End: 2023-02-12
  Administered 2023-02-12: 45 mg via SUBCUTANEOUS

## 2023-02-12 MED ORDER — NUBEQA 300 MG PO TABS: 600.0000 mg | ORAL_TABLET | Freq: Two times a day (BID) | ORAL | 11 refills | Status: DC

## 2023-02-12 NOTE — Progress Notes (Signed)
Eligard SubQ Injection   Due to Prostate Cancer patient is present today for a Eligard Injection.  Medication: Eligard 6 month Dose: 45 mg  Location: left UOQ Lot: [id number] Exp: 02/2024  Patient tolerated well, no complications were noted  Performed by: Jearld Pies RMA  Per Dr. Apolinar Junes patient is to continue therapy for until 2025-2026 potentially longer . Patient's next follow up was scheduled for 08/07/2023. This appointment was scheduled using wheel and given to patient today along with reminder continue on Vitamin D 800-1000iu and Calcium 1000-1200mg  daily while on Androgen Deprivation Therapy.  PA approval dates: NO pa needed

## 2023-02-12 NOTE — Progress Notes (Signed)
I,Amy L Pierron,acting as a scribe for Vanna Scotland, MD.,have documented all relevant documentation on the behalf of Vanna Scotland, MD,as directed by  Vanna Scotland, MD while in the presence of Vanna Scotland, MD.  02/12/2023 11:10 AM   Howard Stafford 05-Apr-1946 [id number]  Referring provider: Jaclyn Shaggy, MD [address]   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   His PSA had risen to 92.1 on 06/02/2021. He underwent a prostate biopsy on 06/14/2021. Prostate biopsy was complicated by vasovagal episode he was admitted into the hospital for neurological and cardiovascular work-up which was all negative.    Surgical pathology was consistent with Gleason 4+3 involving three cores affecting up to 91%, Gleason 3+4 involving 6 cores affecting up to 100%, Gleason 3+3 involving 3 cores affecting up to 25%.    PSMA PET on 06/28/2021 scan visualized Intense radiotracer activity within LEFT RIGHT lobe prostate gland consistent with high-grade prostate adenocarcinoma. Multiple enlarged intensely radiotracer avid lymph nodes primarily in medial rectum of the pelvis. Single retroperitoneal periaortic metastatic node just above the bifurcation. This is the most superior metastatic node in the abdomen pelvis. No evidence of visceral metastasis.Solitary intensely radiotracer avid bone lesion in the T7 vertebral body. Differential would include benign activity associated with a Schmorl's node versus solitary skeletal metastasis. CT findings favor Schmorl's nodes.   S/p IMRT completed 10/12/21.   Currently on Eligard and [place]   His most recent PSA from 02/07/2023 remains undetectable.  He is doing well overall. 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   He occasionally has  had hard bowel movements and will try Metamucil or a stool softener.  PMH: Past Medical History:  Diagnosis Date   Prostate cancer [facility])    Skin cancer face   Basal cell carcinoma of nose    Surgical History: Past Surgical History:  Procedure Laterality Date   BACK SURGERY     04/30/1984 - 04/29/1985   EYE SURGERY Left 04/08/2020   TRANSFER ADJACENT TISSUE EYELID, NOSE, EAR AND/OR LIP   LOWER EXTREMITY ANGIOGRAPHY Right 07/24/2021   Procedure: Lower Extremity Angiography;  Surgeon: Annice Needy, MD;  Location: [facility] INVASIVE CV LAB;  Service: Cardiovascular;  Laterality: Right;   LOWER EXTREMITY ANGIOGRAPHY Right 12/08/2021   Procedure: Lower Extremity Angiography;  Surgeon: Annice Needy, MD;  Location: [facility] INVASIVE CV LAB;  Service: Cardiovascular;  Laterality: Right;   MOHS SURGERY     04/30/2009 - 04/29/2010   skin cancer removal  2015   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 If you have any questions, ask your nurse or doctor.          aspirin EC 81 MG tablet Take 1 tablet (81 mg total) by mouth daily.   atorvastatin 10 MG tablet Commonly known as: Lipitor Take 1 tablet (10 mg total) by mouth daily.   clopidogrel 75 MG tablet Commonly known as: PLAVIX Take 1 tablet (75 mg total) by mouth daily.   leuprolide (6 Month) 45 MG injection Commonly known as: ELIGARD Inject 45 mg into  the skin every 6 (six) months.   lisinopril 10 MG tablet Commonly known as: ZESTRIL Take 10 mg by mouth daily.   Nubeqa 300 MG tablet Generic drug: darolutamide Take 2 tablets (600 mg total) by mouth 2 (two) times daily with a meal.       Social History:  reports that he has been smoking cigarettes. He has a 50 pack-year smoking history. He has been exposed to tobacco smoke. He has never used smokeless tobacco. He reports current alcohol use. He  reports that he does not use drugs.   Physical Exam: BP (!) 156/85   Pulse 68   Ht 5\' 10"  (1.778 m)   Wt 192 lb 6 oz (87.3 kg)   BMI 27.60 kg/m   Constitutional:  Alert and oriented, No acute distress. HEENT:  AT, moist mucus membranes.  Trachea midline, no masses. Neurologic: Grossly intact, no focal deficits, moving all 4 extremities. Psychiatric: Normal mood and affect.   Assessment & Plan:    1. Prostate cancer - Eligard injection today (6 mo depo). Continue Nubeqa and refilled today. Will consider stopping both of these as he approaches the 2-3 year mark given that the radiation was with curative intent and will consider resuming if his PSA starts to accelerate.  - Calcium and vitamin D recommendations reviewed; continue weight bearing exercises. -PSA remains very low but not undetectable  2. Constipation  - Recommend OTC stool softener and increased fluid intake.   Return in about 6 months (around 08/13/2023).  I have reviewed the above documentation for accuracy and completeness, and I agree with the above.   Vanna Scotland, MD   [facility] [address] [place], [place] [address] [phone]

## 2023-02-13 ENCOUNTER — Telehealth: Payer: Self-pay | Admitting: Urology

## 2023-02-13 NOTE — Telephone Encounter (Signed)
Patient's wife, Darl Pikes [phone]) called the office today requesting that we sent the patient's prescription for Nubeqa to the [employer].  She says that they help them financially with getting this medication.  They contacted the [facility] and were informed that they would reach out to the office.

## 2023-02-14 NOTE — Telephone Encounter (Signed)
Called [employer] and spoke with Selena Batten- pharmacist- and gave verbal for refill on the medication.

## 2023-02-14 NOTE — Telephone Encounter (Signed)
Spoke with Howard Stafford and asked for her to provide me phone number for this company as we have not received anything from them still. She will call me back with that information.

## 2023-02-14 NOTE — Telephone Encounter (Signed)
Patient's wife called the office today to provide the telephone number to the [employer] at [phone].

## 2023-02-28 ENCOUNTER — Encounter: Payer: Self-pay | Admitting: Radiation Oncology

## 2023-02-28 ENCOUNTER — Ambulatory Visit
Admission: RE | Admit: 2023-02-28 | Discharge: 2023-02-28 | Disposition: A | Discharge status: Home / Self Care | Payer: Medicare Other | Source: Ambulatory Visit | Attending: Radiation Oncology | Admitting: Radiation Oncology

## 2023-02-28 VITALS — BP 136/75 | HR 64 | Temp 97.4°F | Resp 16 | Wt 195.0 lb

## 2023-02-28 DIAGNOSIS — Z923 Personal history of irradiation: Secondary | ICD-10-CM | POA: Diagnosis not present

## 2023-02-28 DIAGNOSIS — C61 Malignant neoplasm of prostate: Secondary | ICD-10-CM | POA: Diagnosis present

## 2023-02-28 NOTE — Progress Notes (Signed)
Radiation Oncology Follow up Note  Name: Howard Stafford   Date:   02/28/2023 MRN:  [id number] DOB: 1946/04/16    This 77 y.o. male presents to the clinic today for 25-month follow-up status post IMRT radiation therapy for stage IVa Gleason 7 (4+3) adenocarcinoma presenting with a PSA in the 90 range.  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 The patient's PSA was initially in the ninety range but has since remained undetectable at less than 0.01 while on Eligard and Nubeqa. The patient is under the care of Dr. Alvan Dame, who has him on androgen deprivation therapy (Eligard) and [place] The patient reports no significant side effects from these medications and is seen by Dr. Alvan Dame approximately every six months.  He specifically denies any increased lower urinary tract symptoms diarrhea or fatigue..  COMPLICATIONS OF TREATMENT: none  FOLLOW UP COMPLIANCE: keeps appointments   PHYSICAL EXAM:  BP 136/75   Pulse 64   Temp (!) 97.4 F (36.3 C) (Tympanic)   Resp 16   Wt 195 lb (88.5 kg)   BMI 27.98 kg/m  Well-developed well-nourished patient in NAD. HEENT reveals PERLA, EOMI, discs not visualized.  Oral cavity is clear. No oral mucosal lesions are identified. Neck is clear without evidence of cervical or supraclavicular adenopathy. Lungs are clear to A&P. Cardiac examination is essentially unremarkable with regular rate and rhythm without murmur rub or thrill. Abdomen is benign with no organomegaly or masses noted. Motor sensory and DTR levels are equal and symmetric in the upper and lower extremities. Cranial nerves II through XII are grossly intact. Proprioception is intact. No peripheral adenopathy or edema is identified. No motor or sensory levels are noted. Crude visual fields are within normal range.  RADIOLOGY  RESULTS: No current films for review  PLAN: Prostate Adenocarcinoma (Stage 4A, T2, N1, M0, Gleason 7 (4+3)) Patient is 16 months post-IMRT radiation therapy to prostate and pelvic nodes. Currently on Eligard and Nubeqa with undetectable PSA (<0.01). No reported side effects from medications. -Continue current treatment regimen under the care of Dr. Alvan Dame. -Next follow-up in 1 year. Possible transition to Dr. Shella Maxim care only after next couple of visits.    Carmina Miller, MD

## 2023-06-04 ENCOUNTER — Ambulatory Visit (INDEPENDENT_AMBULATORY_CARE_PROVIDER_SITE_OTHER): Payer: Medicare Other | Admitting: Vascular Surgery

## 2023-06-04 ENCOUNTER — Encounter (INDEPENDENT_AMBULATORY_CARE_PROVIDER_SITE_OTHER): Payer: Self-pay | Admitting: Vascular Surgery

## 2023-06-04 ENCOUNTER — Ambulatory Visit (INDEPENDENT_AMBULATORY_CARE_PROVIDER_SITE_OTHER): Payer: Medicare Other

## 2023-06-04 VITALS — BP 153/79 | HR 67 | Resp 18 | Ht 70.0 in | Wt 194.2 lb

## 2023-06-04 DIAGNOSIS — I70211 Atherosclerosis of native arteries of extremities with intermittent claudication, right leg: Secondary | ICD-10-CM | POA: Diagnosis not present

## 2023-06-04 MED ORDER — CLOPIDOGREL BISULFATE 75 MG PO TABS: 75.0000 mg | ORAL_TABLET | Freq: Every day | ORAL | 6 refills | Status: AC

## 2023-06-04 NOTE — Progress Notes (Signed)
 MRN : [id number]  Howard Stafford is a 78 y.o. (May 12, 1945) male who presents with chief complaint of  Chief Complaint  Patient presents with   Follow-up    6 month follow up with ABI  .  History of Present Illness: Patient returns today in follow up of his PAD.  He has undergone extensive lower extremity revascularization in the past predominantly on the right lower extremity although left iliac intervention has been performed as well.  He is currently doing well.  He denies any lifestyle limiting claudication, ischemic rest pain, or ulceration.  He continues on Plavix  which she says he needs a refill on today, aspirin , and Lipitor.  His ABIs today are stable at 1.05 on the right and 0.76 on the left.  He has triphasic waveforms on the right and biphasic waveforms on the left  Current Outpatient Medications  Medication Sig Dispense Refill   aspirin  EC 81 MG tablet Take 1 tablet (81 mg total) by mouth daily. 150 tablet 2   atorvastatin  (LIPITOR) 10 MG tablet Take 1 tablet (10 mg total) by mouth daily. 30 tablet 11   clopidogrel  (PLAVIX ) 75 MG tablet Take 1 tablet (75 mg total) by mouth daily. 90 tablet 3   clopidogrel  (PLAVIX ) 75 MG tablet Take 1 tablet (75 mg total) by mouth daily. 30 tablet 6   darolutamide  (NUBEQA ) 300 MG tablet Take 2 tablets (600 mg total) by mouth 2 (two) times daily with a meal. 120 tablet 11   leuprolide , 6 Month, (ELIGARD ) 45 MG injection Inject 45 mg into the skin every 6 (six) months.     lisinopril  (ZESTRIL ) 10 MG tablet Take 10 mg by mouth daily.     No current facility-administered medications for this visit.    Past Medical History:  Diagnosis Date   Prostate cancer [facility])    Skin cancer face   Basal cell carcinoma of nose    Past Surgical History:  Procedure Laterality Date   BACK SURGERY     04/30/1984 - 04/29/1985   EYE SURGERY Left 04/08/2020   TRANSFER ADJACENT TISSUE EYELID, NOSE, EAR AND/OR LIP   LOWER EXTREMITY ANGIOGRAPHY Right  07/24/2021   Procedure: Lower Extremity Angiography;  Surgeon: Marea Selinda RAMAN, MD;  Location: [facility] INVASIVE CV LAB;  Service: Cardiovascular;  Laterality: Right;   LOWER EXTREMITY ANGIOGRAPHY Right 12/08/2021   Procedure: Lower Extremity Angiography;  Surgeon: Marea Selinda RAMAN, MD;  Location: [facility] INVASIVE CV LAB;  Service: Cardiovascular;  Laterality: Right;   MOHS SURGERY     04/30/2009 - 04/29/2010   skin cancer removal  2015   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 No pertinent family history.   No Known Allergies   REVIEW OF SYSTEMS (Negative unless checked)  Constitutional: [] Weight loss  [] Fever  [] Chills Cardiac: [] Chest pain   [] Chest pressure   [] Palpitations   [] Shortness of breath when laying flat   [] Shortness of breath at rest   [] Shortness of breath with exertion. Vascular:  [x] Pain in legs with walking   [] Pain in  legs at rest   [] Pain in legs when laying flat   [x] Claudication   [] Pain in feet when walking  [] Pain in feet at rest  [] Pain in feet when laying flat   [] History of DVT   [] Phlebitis   [] Swelling in legs   [] Varicose veins   [] Non-healing ulcers Pulmonary:   [] Uses home oxygen   [] Productive cough   [] Hemoptysis   [] Wheeze  [] COPD   [] Asthma Neurologic:  [] Dizziness  [] Blackouts   [] Seizures   [] History of stroke   [] History of TIA  [] Aphasia   [] Temporary blindness   [] Dysphagia   [] Weakness or numbness in arms   [] Weakness or numbness in legs Musculoskeletal:  [] Arthritis   [] Joint swelling   [] Joint pain   [] Low back pain Hematologic:  [] Easy bruising  [] Easy bleeding   [] Hypercoagulable state   [] Anemic   Gastrointestinal:   [] Blood in stool   [] Vomiting blood  [] Gastroesophageal reflux/heartburn   [] Abdominal pain Genitourinary:  [] Chronic kidney disease   [] Difficult urination  [] Frequent urination  [] Burning with urination   [] Hematuria Skin:  [] Rashes   [] Ulcers   [] Wounds Psychological:  [] History of anxiety   []  History of major depression.  Physical Examination  BP (!) 153/79   Pulse 67   Resp 18   Ht 5' 10 (1.778 m)   Wt 194 lb 3.2 oz (88.1 kg)   BMI 27.86 kg/m  Gen:  WD/WN, NAD. Appears younger than stated age. Head: [place]/AT, No temporalis wasting. Ear/Nose/Throat: Hearing grossly intact, nares w/o erythema or drainage Eyes: Conjunctiva clear. Sclera non-icteric Neck: Supple.  Trachea midline Pulmonary:  Good air movement, no use of accessory muscles.  Cardiac: RRR, no JVD Vascular:  Vessel Right Left  Radial Palpable Palpable                          PT Palpable Palpable  DP Palpable Palpable   Gastrointestinal: soft, non-tender/non-distended. No guarding/reflex.  Musculoskeletal: M/S 5/5 throughout.  No deformity or atrophy. No edema. Neurologic: Sensation grossly intact in extremities.  Symmetrical.  Speech is fluent.  Psychiatric: Judgment intact, Mood & affect appropriate for pt's clinical situation. Dermatologic: No rashes or ulcers noted.  No cellulitis or open wounds.      Labs No results found for this or any previous visit (from the past 2160 hours).  Radiology No results found.  Assessment/Plan  Atherosclerosis of native arteries of extremity with intermittent claudication (HCC) His ABIs today are stable at 1.05 on the right and 0.76 on the left.  He has triphasic waveforms on the right and biphasic waveforms on the left a new prescription for Plavix  will be sent in.  No changes.  Continue to follow-up on 26-month intervals with noninvasive studies.    Selinda Gu, MD  06/04/2023 10:44 AM    This note was created with Dragon medical transcription system.  Any  errors from dictation are purely unintentional

## 2023-06-04 NOTE — Assessment & Plan Note (Signed)
 His ABIs today are stable at 1.05 on the right and 0.76 on the left.  He has triphasic waveforms on the right and biphasic waveforms on the left a new prescription for Plavix  will be sent in.  No changes.  Continue to follow-up on 49-month intervals with noninvasive studies.

## 2023-06-07 LAB — VAS US ABI WITH/WO TBI
Left ABI: 0.76
Right ABI: 1.05

## 2023-07-03 ENCOUNTER — Encounter: Payer: Self-pay | Admitting: Ophthalmology

## 2023-07-03 NOTE — Anesthesia Preprocedure Evaluation (Addendum)
 Anesthesia Evaluation  Patient identified by MRN, date of birth, ID band Patient awake    Reviewed: Allergy & Precautions, H&P , NPO status , Patient's Chart, lab work & pertinent test results  Airway Mallampati: III  TM Distance: >3 FB Neck ROM: Full    Dental no notable dental hx. (+) Poor Dentition, Chipped, Missing Couple missing, none loose:   Pulmonary neg pulmonary ROS, Current Smoker   Pulmonary exam normal breath sounds clear to auscultation       Cardiovascular hypertension, + Peripheral Vascular Disease  negative cardio ROS Normal cardiovascular exam Rhythm:Regular Rate:Normal  06-15-21 1. Left ventricular ejection fraction, by estimation, is 60 to 65%. The  left ventricle has normal function. The left ventricle has no regional  wall motion abnormalities. Left ventricular diastolic parameters were  normal.   2. Right ventricular systolic function is normal. The right ventricular  size is normal.   3. The mitral valve is normal in structure. Trivial mitral valve  regurgitation.   4. The aortic valve is normal in structure. Aortic valve regurgitation is  not visualized.     Neuro/Psych negative neurological ROS  negative psych ROS   GI/Hepatic negative GI ROS, Neg liver ROS,,,  Endo/Other  negative endocrine ROS    Renal/GU negative Renal ROS  negative genitourinary   Musculoskeletal negative musculoskeletal ROS (+)    Abdominal   Peds negative pediatric ROS (+)  Hematology negative hematology ROS (+)   Anesthesia Other Findings HTN PAD  Reproductive/Obstetrics negative OB ROS                              Anesthesia Physical Anesthesia Plan  ASA: 2  Anesthesia Plan: MAC   Post-op Pain Management:    Induction: Intravenous  PONV Risk Score and Plan:   Airway Management Planned: Natural Airway and Nasal Cannula  Additional Equipment:   Intra-op Plan:    Post-operative Plan:   Informed Consent: I have reviewed the patients History and Physical, chart, labs and discussed the procedure including the risks, benefits and alternatives for the proposed anesthesia with the patient or authorized representative who has indicated his/her understanding and acceptance.     Dental Advisory Given  Plan Discussed with: Anesthesiologist, CRNA and Surgeon  Anesthesia Plan Comments: (Patient consented for risks of anesthesia including but not limited to:  - adverse reactions to medications - damage to eyes, teeth, lips or other oral mucosa - nerve damage due to positioning  - sore throat or hoarseness - Damage to heart, brain, nerves, lungs, other parts of body or loss of life  Patient voiced understanding and assent.)         Anesthesia Quick Evaluation

## 2023-07-08 NOTE — Discharge Instructions (Signed)

## 2023-07-10 ENCOUNTER — Encounter: Payer: Self-pay | Admitting: Ophthalmology

## 2023-07-10 ENCOUNTER — Ambulatory Visit
Admission: RE | Admit: 2023-07-10 | Discharge: 2023-07-10 | Disposition: A | Discharge status: Home / Self Care | Payer: Medicare Other | Attending: Ophthalmology | Admitting: Ophthalmology

## 2023-07-10 ENCOUNTER — Ambulatory Visit: Payer: Self-pay | Admitting: Anesthesiology

## 2023-07-10 ENCOUNTER — Other Ambulatory Visit: Payer: Self-pay

## 2023-07-10 ENCOUNTER — Encounter
Admission: RE | Disposition: A | Discharge status: Home / Self Care | Payer: Self-pay | Source: Home / Self Care | Attending: Ophthalmology

## 2023-07-10 DIAGNOSIS — H2512 Age-related nuclear cataract, left eye: Secondary | ICD-10-CM | POA: Diagnosis present

## 2023-07-10 DIAGNOSIS — I739 Peripheral vascular disease, unspecified: Secondary | ICD-10-CM | POA: Diagnosis not present

## 2023-07-10 DIAGNOSIS — I10 Essential (primary) hypertension: Secondary | ICD-10-CM | POA: Diagnosis not present

## 2023-07-10 DIAGNOSIS — F17210 Nicotine dependence, cigarettes, uncomplicated: Secondary | ICD-10-CM | POA: Insufficient documentation

## 2023-07-10 HISTORY — PX: CATARACT EXTRACTION W/PHACO: SHX586

## 2023-07-10 HISTORY — DX: Peripheral vascular disease, unspecified: I73.9

## 2023-07-10 HISTORY — DX: Essential (primary) hypertension: I10

## 2023-07-10 SURGERY — PHACOEMULSIFICATION, CATARACT, WITH IOL INSERTION
Anesthesia: Monitor Anesthesia Care | Site: Eye | Laterality: Left

## 2023-07-10 MED ORDER — BRIMONIDINE TARTRATE-TIMOLOL 0.2-0.5 % OP SOLN
OPHTHALMIC | Status: DC | PRN
  Administered 2023-07-10: 1 [drp] via OPHTHALMIC

## 2023-07-10 MED ORDER — SIGHTPATH DOSE#1 BSS IO SOLN
INTRAOCULAR | Status: DC | PRN
  Administered 2023-07-10: 69 mL via OPHTHALMIC

## 2023-07-10 MED ORDER — MIDAZOLAM HCL 2 MG/2ML IJ SOLN
INTRAMUSCULAR | Status: DC | PRN
  Administered 2023-07-10 (×2): 1 mg via INTRAVENOUS

## 2023-07-10 MED ORDER — TETRACAINE HCL 0.5 % OP SOLN
1.0000 [drp] | OPHTHALMIC | Status: DC | PRN
  Administered 2023-07-10 (×3): 1 [drp] via OPHTHALMIC

## 2023-07-10 MED ORDER — FENTANYL CITRATE (PF) 100 MCG/2ML IJ SOLN
INTRAMUSCULAR | Status: AC
  Filled 2023-07-10: qty 2

## 2023-07-10 MED ORDER — TETRACAINE HCL 0.5 % OP SOLN
OPHTHALMIC | Status: AC
Start: 2023-07-10 — End: ?
  Filled 2023-07-10: qty 4

## 2023-07-10 MED ORDER — ARMC OPHTHALMIC DILATING DROPS
1.0000 | OPHTHALMIC | Status: DC | PRN
  Administered 2023-07-10 (×3): 1 via OPHTHALMIC

## 2023-07-10 MED ORDER — ARMC OPHTHALMIC DILATING DROPS
OPHTHALMIC | Status: AC
  Filled 2023-07-10: qty 0.5

## 2023-07-10 MED ORDER — LIDOCAINE HCL (PF) 2 % IJ SOLN
INTRAOCULAR | Status: DC | PRN
  Administered 2023-07-10: 2 mL

## 2023-07-10 MED ORDER — SIGHTPATH DOSE#1 BSS IO SOLN
INTRAOCULAR | Status: DC | PRN
  Administered 2023-07-10: 15 mL via INTRAOCULAR

## 2023-07-10 MED ORDER — CEFUROXIME OPHTHALMIC INJECTION 1 MG/0.1 ML
INJECTION | OPHTHALMIC | Status: DC | PRN
  Administered 2023-07-10: .1 mL via INTRACAMERAL

## 2023-07-10 MED ORDER — FENTANYL CITRATE (PF) 100 MCG/2ML IJ SOLN
INTRAMUSCULAR | Status: DC | PRN
Start: 2023-07-10 — End: 2023-07-10
  Administered 2023-07-10: 50 ug via INTRAVENOUS

## 2023-07-10 MED ORDER — SIGHTPATH DOSE#1 NA HYALUR & NA CHOND-NA HYALUR IO KIT
PACK | INTRAOCULAR | Status: DC | PRN
  Administered 2023-07-10: 1 via OPHTHALMIC

## 2023-07-10 MED ORDER — MIDAZOLAM HCL 2 MG/2ML IJ SOLN
INTRAMUSCULAR | Status: AC
  Filled 2023-07-10: qty 2

## 2023-07-10 SURGICAL SUPPLY — 10 items
CATARACT SUITE SIGHTPATH (MISCELLANEOUS) ×1 IMPLANT
FEE CATARACT SUITE SIGHTPATH (MISCELLANEOUS) ×1 IMPLANT
GLOVE BIOGEL PI IND STRL 8 (GLOVE) ×1 IMPLANT
GLOVE SURG LX STRL 7.5 STRW (GLOVE) ×1 IMPLANT
GLOVE SURG PROTEXIS BL SZ6.5 (GLOVE) ×1 IMPLANT
GLOVE SURG SYN 6.5 PF PI BL (GLOVE) ×1 IMPLANT
LENS IOL TECNIS EYHANCE 20.0 (Intraocular Lens) IMPLANT
NDL FILTER BLUNT 18X1 1/2 (NEEDLE) ×1 IMPLANT
NEEDLE FILTER BLUNT 18X1 1/2 (NEEDLE) ×1 IMPLANT
RING MALYGIN 7.0 (MISCELLANEOUS) IMPLANT

## 2023-07-10 NOTE — Anesthesia Postprocedure Evaluation (Signed)
 Anesthesia Post Note  Patient: Howard Stafford  Procedure(s) Performed: CATARACT EXTRACTION PHACO AND INTRAOCULAR LENS PLACEMENT (IOC) LEFT 12.98 00:52.8 (Left: Eye)  Patient location during evaluation: PACU Anesthesia Type: MAC Level of consciousness: awake and alert Pain management: pain level controlled Vital Signs Assessment: post-procedure vital signs reviewed and stable Respiratory status: spontaneous breathing, nonlabored ventilation, respiratory function stable and patient connected to nasal cannula oxygen Cardiovascular status: stable and blood pressure returned to baseline Postop Assessment: no apparent nausea or vomiting Anesthetic complications: no   No notable events documented.   Last Vitals:  Vitals:   07/10/23 0952 07/10/23 0953  BP: 105/81   Pulse:  71  Resp:  17  Temp: 36.8 C   SpO2: 98% 97%    Last Pain:  Vitals:   07/10/23 0952  TempSrc:   PainSc: 0-No pain                 Marisue Humble

## 2023-07-10 NOTE — H&P (Signed)
 [facility]   Primary Care Physician:  Jaclyn Shaggy, MD Ophthalmologist: Dr. Lockie Mola  Pre-Procedure History & Physical: HPI:  Howard Stafford is a 78 y.o. male here for ophthalmic surgery.   Past Medical History:  Diagnosis Date   Hypertension    PAD (peripheral artery disease) (HCC)    Prostate cancer (HCC)    Skin cancer face   Basal cell carcinoma of nose    Past Surgical History:  Procedure Laterality Date   BACK SURGERY     04/30/1984 - 04/29/1985   EYE SURGERY Left 04/08/2020   TRANSFER ADJACENT TISSUE EYELID, NOSE, EAR AND/OR LIP   LOWER EXTREMITY ANGIOGRAPHY Right 07/24/2021   Procedure: Lower Extremity Angiography;  Surgeon: Annice Needy, MD;  Location: [facility] IN[facility];  Service: Cardiovascular;  Laterality: Right;   LOWER EXTREMITY ANGIOGRAPHY Right 12/08/2021   Procedure: Lower Extremity Angiography;  Surgeon: Annice Needy, MD;  Location: [facility] CV [facility];  Service: Cardiovascular;  Laterality: Right;   MOHS SURGERY     04/30/2009 - 04/29/2010   skin cancer removal  2015   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 Authorizing Provider  aspirin EC 81 MG tablet Take 1 tablet (81 mg total) by mouth daily. 07/25/21  Yes Dew, Marlow Baars, MD  atorvastatin (LIPITOR) 10 MG tablet Take 1 tablet (10 mg total) by mouth daily. 07/25/21 11/27/23 Yes Dew, Marlow Baars, MD  clopidogrel (PLAVIX) 75 MG tablet Take 1 tablet (75 mg total) by mouth daily. 05/25/22  Yes Georgiana Spinner, NP  clopidogrel (PLAVIX) 75 MG tablet Take 1 tablet (75 mg total) by mouth daily. 06/04/23  Yes Dew, Marlow Baars, MD  darolutamide (NUBEQA) 300 MG tablet Take 2 tablets (600 mg total) by mouth 2 (two) times daily with a meal. 02/12/23  Yes Vanna Scotland, MD  leuprolide, 6 Month, (ELIGARD) 45 MG injection Inject 45 mg into the skin every 6 (six) months.   Yes [provider]  lisinopril (ZESTRIL) 10 MG tablet Take 10  mg by mouth daily. 07/14/21  Yes [provider]    Allergies as of 05/30/2023   (No Known Allergies)    History reviewed. No pertinent family history.  Social History   Socioeconomic History   Marital status: Married    Spouse name: Not on file   Number of children: Not on file   Years of education: Not on file   Highest education level: Not on file  Occupational History   Not on file  Tobacco Use   Smoking status: Every Day    Current packs/day: 1.00    Average packs/day: 1 pack/day for 57.2 years (57.2 ttl pk-yrs)    Types: Cigarettes    Start date: 1968    Passive exposure: Current   Smokeless tobacco: Never  Vaping Use   Vaping status: Never Used  Substance and Sexual Activity   Alcohol use: Yes    Comment: Rare   Drug use: No   Sexual activity: Not on file  Other Topics Concern   Not on file  Social History Narrative   ** Merged History Encounter **       Social Drivers of Health   Financial Resource Strain: Not on file  Food Insecurity: Not on file  Transportation Needs: Not on file  Physical Activity: Not on file  Stress: Not on file  Social Connections: Not on file  Intimate Partner Violence: Not on file    Review of Systems: See HPI, otherwise negative ROS  Physical Exam: BP (!) 155/74   Temp (!) 97.4 F (36.3 C) (Temporal)   Resp 10   Ht 5\' 10"  (1.778 m)   Wt 87 kg   SpO2 96%   BMI 27.52 kg/m  General:   Alert,  pleasant and cooperative in NAD Head:  Normocephalic and atraumatic. Lungs:  Clear to auscultation.    Heart:  Regular rate and rhythm.   Impression/Plan: Howard Stafford is here for ophthalmic surgery.  Risks, benefits, limitations, and alternatives regarding ophthalmic surgery have been reviewed with the patient.  Questions have been answered.  All parties agreeable.   Lockie Mola, MD  07/10/2023, 8:47 AM

## 2023-07-10 NOTE — Op Note (Signed)
 OPERATIVE NOTE  Howard Stafford [id number] 07/10/2023   PREOPERATIVE DIAGNOSIS:  Nuclear sclerotic cataract left eye. H25.12   POSTOPERATIVE DIAGNOSIS:    Nuclear sclerotic cataract left eye.     PROCEDURE:  Phacoemusification with posterior chamber intraocular lens placement of the left eye  Ultrasound time: Procedure(s): CATARACT EXTRACTION PHACO AND INTRAOCULAR LENS PLACEMENT (IOC) LEFT 12.98 00:52.8 (Left)  LENS:   Implant Name Type Inv. Item Serial No. Manufacturer Lot No. LRB No. Used Action  LENS IOL TECNIS EYHANCE 20.0 - [id number] Intraocular Lens LENS IOL TECNIS EYHANCE 20.0 [id number] [employer]  Left 1 Implanted      SURGEON:  Deirdre Evener, MD   ANESTHESIA:  Topical with tetracaine drops and 2% Xylocaine jelly, augmented with 1% preservative-free intracameral lidocaine.    COMPLICATIONS:  None.   DESCRIPTION OF PROCEDURE:  The patient was identified in the holding room and transported to the operating room and placed in the supine position under the operating microscope.  The left eye was identified as the operative eye and it was prepped and draped in the usual sterile ophthalmic fashion.   A 1 millimeter clear-corneal paracentesis was made at the 1:30 position.  0.5 ml of preservative-free 1% lidocaine was injected into the anterior chamber.  The anterior chamber was filled with Viscoat viscoelastic.  A 2.4 millimeter keratome was used to make a near-clear corneal incision at the 10:30 position.  .  A curvilinear capsulorrhexis was made with a cystotome and capsulorrhexis forceps.  Balanced salt solution was used to hydrodissect and hydrodelineate the nucleus.   Phacoemulsification was then used in stop and chop fashion to remove the lens nucleus and epinucleus.  The remaining cortex was then removed using the irrigation and aspiration handpiece. Provisc was then placed into the capsular bag to distend it for lens placement.  A lens was then injected into the  capsular bag.  The remaining viscoelastic was aspirated.   Wounds were hydrated with balanced salt solution.  The anterior chamber was inflated to a physiologic pressure with balanced salt solution.  No wound leaks were noted. Cefuroxime 0.1 ml of a 10mg /ml solution was injected into the anterior chamber for a dose of 1 mg of intracameral antibiotic at the completion of the case.   Timolol and Brimonidine drops were applied to the eye.  The patient was taken to the recovery room in stable condition without complications of anesthesia or surgery.  [name] 07/10/2023, 9:51 AM

## 2023-07-10 NOTE — Transfer of Care (Signed)
 Immediate Anesthesia Transfer of Care Note  Patient: Howard Stafford  Procedure(s) Performed: CATARACT EXTRACTION PHACO AND INTRAOCULAR LENS PLACEMENT (IOC) LEFT 12.98 00:52.8 (Left: Eye)  Patient Location: PACU  Anesthesia Type: MAC  Level of Consciousness: awake, alert  and patient cooperative  Airway and Oxygen Therapy: Patient Spontanous Breathing and Patient connected to supplemental oxygen  Post-op Assessment: Post-op Vital signs reviewed, Patient's Cardiovascular Status Stable, Respiratory Function Stable, Patent Airway and No signs of Nausea or vomiting  Post-op Vital Signs: Reviewed and stable  Complications: No notable events documented.

## 2023-07-11 ENCOUNTER — Encounter: Payer: Self-pay | Admitting: Ophthalmology

## 2023-07-31 ENCOUNTER — Other Ambulatory Visit: Payer: Self-pay

## 2023-07-31 DIAGNOSIS — C61 Malignant neoplasm of prostate: Secondary | ICD-10-CM

## 2023-08-01 LAB — PSA: Prostate Specific Ag, Serum: <0.1 ng/mL (ref 0.0–4.0)

## 2023-08-07 ENCOUNTER — Ambulatory Visit (INDEPENDENT_AMBULATORY_CARE_PROVIDER_SITE_OTHER): Payer: Self-pay | Admitting: Urology

## 2023-08-07 VITALS — BP 146/71 | HR 76

## 2023-08-07 DIAGNOSIS — C61 Malignant neoplasm of prostate: Secondary | ICD-10-CM

## 2023-08-07 DIAGNOSIS — R351 Nocturia: Secondary | ICD-10-CM

## 2023-08-07 MED ORDER — LEUPROLIDE ACETATE (6 MONTH) 45 MG ~~LOC~~ KIT
45.0000 mg | PACK | Freq: Once | SUBCUTANEOUS | Status: AC
  Administered 2023-08-07: 45 mg via SUBCUTANEOUS

## 2023-08-07 NOTE — Progress Notes (Signed)
 Marcelle Overlie Plume,acting as a scribe for Vanna Scotland, MD.,have documented all relevant documentation on the behalf of Vanna Scotland, MD,as directed by  Vanna Scotland, MD while in the presence of Vanna Scotland, MD.  08/07/23 11:43 AM   Howard Stafford [birth year] [id number]  Referring provider: Jaclyn Shaggy, MD [address]   [place],  [place] [address]  Chief Complaint  Patient presents with   Follow-up    HPI:  78 year-old male returning for a six-month follow-up for a personal history of metastatic castrate-sensitive prostate cancer and is currently being managed with ADT using Eligard and Nubeqa. He reports that his prostate cancer is under excellent control and expresses satisfaction with his recent lab results.   His PSA had risen to 92.1 on 06/02/2021. He underwent a prostate biopsy on 06/14/2021. Prostate biopsy was complicated by vasovagal episode he was admitted into the hospital for neurological and cardiovascular work-up which was all negative.    Surgical pathology was consistent with Gleason 4+3 involving three cores affecting up to 91%, Gleason 3+4 involving 6 cores affecting up to 100%, Gleason 3+3 involving 3 cores affecting up to 25%.    PSMA PET on 06/28/2021 scan visualized Intense radiotracer activity within LEFT RIGHT lobe prostate gland consistent with high-grade prostate adenocarcinoma. Multiple enlarged intensely radiotracer avid lymph nodes primarily in medial rectum of the pelvis. Single retroperitoneal periaortic metastatic node just above the bifurcation. This is the most superior metastatic node in the abdomen pelvis. No evidence of visceral metastasis.Solitary intensely radiotracer avid bone lesion in the T7 vertebral body. Differential would include benign activity associated with a Schmorl's node versus solitary skeletal metastasis. CT findings favor Schmorl's nodes.   S/p IMRT completed 10/12/21.  His most recent PSA on 08/01/2023 was  undetectable.  He denies any significant urinary issues, although he mentions having to get up a couple of times during the night to urinate. He is still working, although he notes that he gets tired after about five hours of work. He stays active by mowing his yard.   He is due for another injection today and is content with the current management plan, expressing a preference to continue as long as it remains effective. He reports no issues with obtaining his medication, as it is consistently shipped to him without problems.  PMH: Past Medical History:  Diagnosis Date   Hypertension    PAD (peripheral artery disease) (HCC)    Prostate cancer (HCC)    Skin cancer face   Basal cell carcinoma of nose    Surgical History: Past Surgical History:  Procedure Laterality Date   BACK SURGERY     04/30/1984 - 04/29/1985   CATARACT EXTRACTION W/PHACO Left 07/10/2023   Procedure: CATARACT EXTRACTION PHACO AND INTRAOCULAR LENS PLACEMENT (IOC) LEFT 12.98 00:52.8;  Surgeon: Lockie Mola, MD;  Location: [facility];  Service: Ophthalmology;  Laterality: Left;   EYE SURGERY Left 04/08/2020   TRANSFER ADJACENT TISSUE EYELID, NOSE, EAR AND/OR LIP   LOWER EXTREMITY ANGIOGRAPHY Right 07/24/2021   Procedure: Lower Extremity Angiography;  Surgeon: Annice Needy, MD;  Location: [facility];  Service: Cardiovascular;  Laterality: Right;   LOWER EXTREMITY ANGIOGRAPHY Right 12/08/2021   Procedure: Lower Extremity Angiography;  Surgeon: Annice Needy, MD;  Location: [facility];  Service: Cardiovascular;  Laterality: Right;   MOHS SURGERY     04/30/2009 - 04/29/2010   skin cancer removal  2015   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 If you have any questions, ask your nurse or doctor.          aspirin EC 81 MG tablet Take 1 tablet (81 mg total)  by mouth daily.   atorvastatin 40 MG tablet Commonly known as: LIPITOR Take 40 mg by mouth daily. What changed: Another medication with the same name was removed. Continue taking this medication, and follow the directions you see here.   clopidogrel 75 MG tablet Commonly known as: PLAVIX Take 1 tablet (75 mg total) by mouth daily.   clopidogrel 75 MG tablet Commonly known as: PLAVIX Take 1 tablet (75 mg total) by mouth daily.   leuprolide (6 Month) 45 MG injection Commonly known as: ELIGARD Inject 45 mg into the skin every 6 (six) months.   lisinopril 10 MG tablet Commonly known as: ZESTRIL Take 10 mg by mouth daily.   Nubeqa 300 MG tablet Generic drug: darolutamide Take 2 tablets (600 mg total) by mouth 2 (two) times daily with a meal.        Social History:  reports that he has been smoking cigarettes. He started smoking about 57 years ago. He has a 57.3 pack-year smoking history. He has been exposed to tobacco smoke. He has never used smokeless tobacco. He reports current alcohol use. He reports that he does not use drugs.   Physical Exam: BP (!) 146/71   Pulse 76   Constitutional:  Alert and oriented, No acute distress. HEENT: [place] AT, moist mucus membranes.  Trachea midline, no masses. Neurologic: Grossly intact, no focal deficits, moving all 4 extremities. Psychiatric: Normal mood and affect.   Assessment & Plan:    1. Prostate Cancer - His prostate cancer remains under excellent control.  - He is currently managed on ADT with Eligard.  - He reports no significant issues, and his lab results are satisfactory.  - The plan is to continue with the current management regimen, including the administration of another Eligard injection today.  - No changes to the treatment plan are necessary at this time.  2. Nocturia - He reports having to get up a couple of times during the night to urinate.  - This is a known side effect of his condition and treatment, and he is  otherwise pleased with his urinary function.  - No additional intervention is required at this time.  Return in about 6 months (around 02/06/2024) for Eligard injection and repeat PSA.   [facility] [address], [address] [place], [place] [address] [phone]

## 2023-08-07 NOTE — Progress Notes (Unsigned)
 Eligard SubQ Injection   Due to Prostate Cancer patient is present today for a Eligard Injection.  Medication: Eligard 6 month Dose: 45 mg  Location: left UOQ Lot: [id number] Exp: 11/28/2024  Patient tolerated well, no complications were noted  Performed by:  Vela Prose  Per Dr. Apolinar Junes patient is to continue therapy for 6 months . Patient's next follow up was scheduled for 01/29/2024. This appointment was scheduled using wheel and given to patient today along with reminder continue on Vitamin D 800-1000iu and Calcium 1000-1200mg  daily while on Androgen Deprivation Therapy.  PA approval dates:

## 2023-08-14 ENCOUNTER — Telehealth: Payer: Self-pay

## 2023-08-14 NOTE — Telephone Encounter (Signed)
 Auth Submission: NO AUTH NEEDED Site of care:  [facility] Payer: medicare a/b, bcbs supp Medication & CPT/J Code(s) submitted: Eligard [id number] Route of submission (phone, fax, portal): portal Phone # Fax # Auth type: Buy/Bill HB Units/visits requested:  Reference number:  Approval from: 08/14/23 to 04/29/24

## 2023-09-17 ENCOUNTER — Encounter (INDEPENDENT_AMBULATORY_CARE_PROVIDER_SITE_OTHER): Payer: Self-pay

## 2023-12-05 ENCOUNTER — Other Ambulatory Visit (INDEPENDENT_AMBULATORY_CARE_PROVIDER_SITE_OTHER): Payer: Self-pay | Admitting: Vascular Surgery

## 2023-12-05 DIAGNOSIS — Z9889 Other specified postprocedural states: Secondary | ICD-10-CM

## 2023-12-06 ENCOUNTER — Ambulatory Visit (INDEPENDENT_AMBULATORY_CARE_PROVIDER_SITE_OTHER): Payer: Medicare Other

## 2023-12-06 ENCOUNTER — Ambulatory Visit (INDEPENDENT_AMBULATORY_CARE_PROVIDER_SITE_OTHER): Payer: Medicare Other | Admitting: Vascular Surgery

## 2023-12-06 VITALS — BP 123/74 | HR 70 | Ht 70.0 in | Wt 182.0 lb

## 2023-12-06 DIAGNOSIS — Z9889 Other specified postprocedural states: Secondary | ICD-10-CM

## 2023-12-06 DIAGNOSIS — I10 Essential (primary) hypertension: Secondary | ICD-10-CM

## 2023-12-06 DIAGNOSIS — I739 Peripheral vascular disease, unspecified: Secondary | ICD-10-CM | POA: Diagnosis not present

## 2023-12-06 DIAGNOSIS — I70211 Atherosclerosis of native arteries of extremities with intermittent claudication, right leg: Secondary | ICD-10-CM

## 2023-12-06 MED ORDER — CLOPIDOGREL BISULFATE 75 MG PO TABS: 75.0000 mg | ORAL_TABLET | Freq: Every day | ORAL | 3 refills | Status: DC

## 2023-12-06 NOTE — Progress Notes (Signed)
 MRN : [id number]  Howard Stafford is a 78 y.o. (1945-06-02) male who presents with chief complaint of No chief complaint on file. SABRA  History of Present Illness: Patient returns today in follow up of his PAD.  He is doing well.  He is about 2 years status post his most recent right lower extremity intervention for peripheral arterial disease.  He has no current lifestyle limiting claudication, ischemic rest pain, or ulceration.  He has minimal claudication symptoms at this point. ABIs today are 1.1 on the right and 0.82 on the left.   Current Outpatient Medications  Medication Sig Dispense Refill   aspirin  EC 81 MG tablet Take 1 tablet (81 mg total) by mouth daily. 150 tablet 2   atorvastatin  (LIPITOR) 40 MG tablet Take 40 mg by mouth daily.     clopidogrel  (PLAVIX ) 75 MG tablet Take 1 tablet (75 mg total) by mouth daily. 30 tablet 6   clopidogrel  (PLAVIX ) 75 MG tablet Take 1 tablet (75 mg total) by mouth daily. 90 tablet 3   darolutamide  (NUBEQA ) 300 MG tablet Take 2 tablets (600 mg total) by mouth 2 (two) times daily with a meal. 120 tablet 11   leuprolide , 6 Month, (ELIGARD ) 45 MG injection Inject 45 mg into the skin every 6 (six) months.     lisinopril  (ZESTRIL ) 10 MG tablet Take 10 mg by mouth daily.     No current facility-administered medications for this visit.    Past Medical History:  Diagnosis Date   Hypertension    PAD (peripheral artery disease) (HCC)    Prostate cancer (HCC)    Skin cancer face   Basal cell carcinoma of nose    Past Surgical History:  Procedure Laterality Date   BACK SURGERY     04/30/1984 - 04/29/1985   CATARACT EXTRACTION W/PHACO Left 07/10/2023   Procedure: CATARACT EXTRACTION PHACO AND INTRAOCULAR LENS PLACEMENT (IOC) LEFT 12.98 00:52.8;  Surgeon: Mittie Gaskin, MD;  Location: [facility];  Service: Ophthalmology;  Laterality: Left;   EYE SURGERY Left 04/08/2020   TRANSFER ADJACENT TISSUE EYELID, NOSE, EAR AND/OR LIP   LOWER  EXTREMITY ANGIOGRAPHY Right 07/24/2021   Procedure: Lower Extremity Angiography;  Surgeon: Marea Selinda RAMAN, MD;  Location: [facility];  Service: Cardiovascular;  Laterality: Right;   LOWER EXTREMITY ANGIOGRAPHY Right 12/08/2021   Procedure: Lower Extremity Angiography;  Surgeon: Marea Selinda RAMAN, MD;  Location: [facility];  Service: Cardiovascular;  Laterality: Right;   MOHS SURGERY     04/30/2009 - 04/29/2010   skin cancer removal  2015   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   No Known Allergies   REVIEW OF SYSTEMS (Negative unless checked)  Constitutional: [] Weight loss  [] Fever  [] Chills Cardiac: [] Chest pain   [] Chest pressure   [] Palpitations   [] Shortness of breath when laying flat   [] Shortness of breath at rest   [] Shortness of breath with exertion. Vascular:  [  x]Pain in legs with walking   [] Pain in legs at rest   [] Pain in legs when laying flat   [x] Claudication   [] Pain in feet when walking  [] Pain in feet at rest  [] Pain in feet when laying flat   [] History of DVT   [] Phlebitis   [] Swelling in legs   [] Varicose veins   [] Non-healing ulcers Pulmonary:   [] Uses home oxygen   [] Productive cough   [] Hemoptysis   [] Wheeze  [] COPD   [] Asthma Neurologic:  [] Dizziness  [] Blackouts   [] Seizures   [] History of stroke   [] History of TIA  [] Aphasia   [] Temporary blindness   [] Dysphagia   [] Weakness or numbness in arms   [] Weakness or numbness in legs Musculoskeletal:  [] Arthritis   [] Joint swelling   [] Joint pain   [] Low back pain Hematologic:  [] Easy bruising  [] Easy bleeding    [] Hypercoagulable state   [] Anemic   Gastrointestinal:  [] Blood in stool   [] Vomiting blood  [] Gastroesophageal reflux/heartburn   [] Abdominal pain Genitourinary:  [] Chronic kidney disease   [] Difficult urination  [] Frequent urination  [] Burning with urination   [] Hematuria Skin:  [] Rashes   [] Ulcers   [] Wounds Psychological:  [] History of anxiety   []  History of major depression.  Physical Examination  BP 123/74   Pulse 70   Ht 5' 10 (1.778 m)   Wt 182 lb (82.6 kg)   BMI 26.11 kg/m  Gen:  WD/WN, NAD. Appears younger than stated age. Head: [place]/AT, No temporalis wasting. Ear/Nose/Throat: Hearing grossly intact, nares w/o erythema or drainage Eyes: Conjunctiva clear. Sclera non-icteric Neck: Supple.  Trachea midline Pulmonary:  Good air movement, no use of accessory muscles.  Cardiac: RRR, no JVD Vascular:  Vessel Right Left  Radial Palpable Palpable                          PT 2+ Palpable 1+ Palpable  DP 2+ Palpable 2+ Palpable   Gastrointestinal: soft, non-tender/non-distended. No guarding/reflex.  Musculoskeletal: M/S 5/5 throughout.  No deformity or atrophy. No edema. Neurologic: Sensation grossly intact in extremities.  Symmetrical.  Speech is fluent.  Psychiatric: Judgment intact, Mood & affect appropriate for pt's clinical situation. Dermatologic: No rashes or ulcers noted.  No cellulitis or open wounds.      Labs No results found for this or any previous visit (from the past 2160 hours).  Radiology No results found.  Assessment/Plan  Hypertension blood pressure control important in reducing the progression of atherosclerotic disease. On appropriate oral medications.   Atherosclerosis of native arteries of extremity with intermittent claudication (HCC) ABIs today are 1.1 on the right and 0.82 on the left.  He has minimal symptoms at this point.  He will continue Plavix , aspirin , and Lipitor.  I sent in a new prescription for Plavix  today.  Follow-up in 1  year.    Selinda Gu, MD  12/06/2023 11:47 AM    This note was created with Dragon medical transcription system.  Any errors from dictation are purely unintentional

## 2023-12-06 NOTE — Assessment & Plan Note (Signed)
 blood pressure control important in reducing the progression of atherosclerotic disease. On appropriate oral medications.

## 2023-12-06 NOTE — Assessment & Plan Note (Signed)
 ABIs today are 1.1 on the right and 0.82 on the left.  He has minimal symptoms at this point.  He will continue Plavix , aspirin , and Lipitor.  I sent in a new prescription for Plavix  today.  Follow-up in 1 year.

## 2023-12-09 LAB — VAS US ABI WITH/WO TBI
Left ABI: 0.82
Right ABI: 1.15

## 2023-12-12 ENCOUNTER — Encounter: Payer: Self-pay | Admitting: Urology

## 2024-01-08 ENCOUNTER — Other Ambulatory Visit: Payer: Self-pay

## 2024-01-08 MED ORDER — NUBEQA 300 MG PO TABS: 600.0000 mg | ORAL_TABLET | Freq: Two times a day (BID) | ORAL | 1 refills | Status: DC

## 2024-01-20 ENCOUNTER — Other Ambulatory Visit: Payer: Self-pay | Admitting: *Deleted

## 2024-01-20 DIAGNOSIS — C61 Malignant neoplasm of prostate: Secondary | ICD-10-CM

## 2024-01-22 ENCOUNTER — Other Ambulatory Visit

## 2024-01-22 DIAGNOSIS — C61 Malignant neoplasm of prostate: Secondary | ICD-10-CM

## 2024-01-23 LAB — PSA: Prostate Specific Ag, Serum: <0.1 ng/mL (ref 0.0–4.0)

## 2024-01-24 NOTE — Progress Notes (Signed)
   01/31/2024 9:46 AM   Charlie ONEIDA Pleas 1945-08-18 [id number]  Reason for visit: Follow up metastatic prostate cancer   HPI: 78 year old initial follow-up with me, previously followed by Dr. Penne Very pleasant gentleman, doing well today, no acute issues  Prior HPI: [place] novo low volume metastatic prostate cancer (dx March 2023) Initial PSA -92.1 Prostate biopsy -GG3 in 3/12, GG2 in 6/12, GG in 3  PSMA PET (Mar 2023 - multiple perirectal nodes, periaortic node, questionable T7 vertebral node)  S/p IMRT to prostate + pelvic nodes (June 2023) (peri-aortic node monitored) On Eligard  + darolutamide    Physical Exam: BP 114/79 (BP Location: Left Arm, Patient Position: Sitting, Cuff Size: Large)   Pulse 73   Ht 5' 10 (1.778 m)   Wt 192 lb 6.4 oz (87.3 kg)   BMI 27.61 kg/m    Constitutional:  Alert and oriented, No acute distress.    Laboratory Data:         Component Ref Range & Units (hover) 2 d ago (01/22/24) 5 mo ago (07/31/23) 11 mo ago (02/07/23) 1 yr ago (08/08/22) 1 yr ago (02/06/22) 2 yr ago (10/30/21)  Prostate Specific Ag, Serum <0.1 <0.1 CM <0.1 CM <0.1 CM <0.1 CM <0.1 CM      Pertinent Imaging: N/A   Assessment & Plan:    Prostate cancer [facility]) Assessment & Plan: 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)  Prostate biopsy -GG3 in 3/12, GG2 in 6/12, GG in 3  (Initial PSA -92.1) PSMA PET (Mar 2023 - multiple perirectal nodes, periaortic node, questionable T7 vertebral node)   S/p IMRT to prostate + pelvic nodes (June 2023) (peri-aortic node monitored) On Eligard  + darolutamide    PSA undetectable since local treatment + ADT/ARPI Last PSA <0.1 (01/22/24)  - 45 mg every 6 month Eligard  dose administered today - continue Eligard  + Nubeqa     -Nubeqa  refilled - check CBC, CMP, total testosterone (he has standing orders for blood work via PCP, will add on testosterone) - May increase PSA interval to q6 mo and align with Eligard  visits -  Recommend close follow up with PCP for bone health survey, consideration of Vit D/Ca+ supplementation   Orders: -     Nubeqa ; Take 2 tablets (600 mg total) by mouth 2 (two) times daily with a meal.  Dispense: 60 tablet; Refill: 3 -     PSA; Future -     Leuprolide  Acetate (6 Month)  Low testosterone -     Testosterone; Future -     Leuprolide  Acetate (6 Month)  Testicular hypofunction -     Testosterone; Future       Penne JONELLE Skye, MD  [facility] [address], [address] [place], [place] [address] [phone]

## 2024-01-24 NOTE — Assessment & Plan Note (Addendum)
 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)  Prostate biopsy -GG3 in 3/12, GG2 in 6/12, GG in 3  (Initial PSA -92.1) PSMA PET (Mar 2023 - multiple perirectal nodes, periaortic node, questionable T7 vertebral node)   S/p IMRT to prostate + pelvic nodes (June 2023) (peri-aortic node monitored) On Eligard  + darolutamide    PSA undetectable since local treatment + ADT/ARPI Last PSA <0.1 (01/22/24)  - 45 mg every 6 month Eligard  dose administered today - continue Eligard  + Nubeqa     -Nubeqa  refilled - check CBC, CMP, total testosterone (he has standing orders for blood work via PCP, will add on testosterone) - May increase PSA interval to q6 mo and align with Eligard  visits - Recommend close follow up with PCP for bone health survey, consideration of Vit D/Ca+ supplementation

## 2024-01-29 ENCOUNTER — Encounter: Payer: Self-pay | Admitting: Family Medicine

## 2024-01-29 ENCOUNTER — Ambulatory Visit (INDEPENDENT_AMBULATORY_CARE_PROVIDER_SITE_OTHER): Admitting: Family Medicine

## 2024-01-29 ENCOUNTER — Ambulatory Visit: Admitting: Urology

## 2024-01-29 VITALS — BP 124/68 | HR 81 | Ht 70.0 in | Wt 193.0 lb

## 2024-01-29 DIAGNOSIS — I10 Essential (primary) hypertension: Secondary | ICD-10-CM

## 2024-01-29 DIAGNOSIS — E782 Mixed hyperlipidemia: Secondary | ICD-10-CM

## 2024-01-29 DIAGNOSIS — Z8546 Personal history of malignant neoplasm of prostate: Secondary | ICD-10-CM | POA: Diagnosis not present

## 2024-01-29 DIAGNOSIS — Z130 Encounter for screening for diseases of the blood and blood-forming organs and certain disorders involving the immune mechanism: Secondary | ICD-10-CM

## 2024-01-29 DIAGNOSIS — Z131 Encounter for screening for diabetes mellitus: Secondary | ICD-10-CM

## 2024-01-29 DIAGNOSIS — I739 Peripheral vascular disease, unspecified: Secondary | ICD-10-CM

## 2024-01-29 DIAGNOSIS — Z79899 Other long term (current) drug therapy: Secondary | ICD-10-CM

## 2024-01-29 DIAGNOSIS — Z13220 Encounter for screening for lipoid disorders: Secondary | ICD-10-CM

## 2024-01-29 DIAGNOSIS — Z136 Encounter for screening for cardiovascular disorders: Secondary | ICD-10-CM

## 2024-01-29 MED ORDER — ATORVASTATIN CALCIUM 40 MG PO TABS: 40.0000 mg | ORAL_TABLET | Freq: Every day | ORAL | 1 refills | Status: DC

## 2024-01-29 MED ORDER — LISINOPRIL 10 MG PO TABS: 10.0000 mg | ORAL_TABLET | Freq: Every day | ORAL | 1 refills | Status: AC

## 2024-01-29 NOTE — Addendum Note (Signed)
 Addended by: SOL MACKEY SOR K on: 01/29/2024 10:10 AM   Modules accepted: Level of Service

## 2024-01-29 NOTE — Progress Notes (Signed)
 New Patient Office Visit  Subjective    Patient ID: Howard Stafford, male    DOB: [birth year]  Age: 78 y.o. MRN: [id number]  CC:  Chief Complaint  Patient presents with   New Patient (Initial Visit)   Hyperlipidemia   Hypertension     Assessment & Plan:   History of prostate cancer  PAD (peripheral artery disease) -     Comprehensive metabolic panel with GFR  Encounter for lipid screening for cardiovascular disease -     Lipid panel  Diabetes mellitus screening -     Hemoglobin A1c  Screening for iron deficiency anemia  Essential hypertension -     Lisinopril ; Take 1 tablet (10 mg total) by mouth daily.  Dispense: 90 tablet; Refill: 1  Mixed hyperlipidemia -     Atorvastatin  Calcium ; Take 1 tablet (40 mg total) by mouth daily.  Dispense: 90 tablet; Refill: 1  Encounter for long-term current use of medication -     CBC with Differential/Platelet   Assessment and Plan Assessment & Plan Prostate cancer, actively managed Prostate cancer managed with Nubeqa  and injections. PSA undetectable, indicating effective treatment. Regular urologist follow-ups maintained. - Continue Nubeqa  and scheduled injections. - Follow up with urologist as planned.  Peripheral arterial disease of right leg, status post stenting Peripheral arterial disease in right leg post-stenting with four stents. Managed with blood thinners, stable per vascular surgeon. - Continue blood thinners as prescribed. - Follow up with vascular surgeon annually.  History of melanoma, status post excision History of melanoma post-excision. No recent dermatologist follow-up, no new concerning spots. - Recommend follow-up appointment with dermatologist for skin examination.  History of basal cell carcinoma of skin History of basal cell carcinoma with previous Mohs surgeries. No recent dermatologist follow-up. - Recommend follow-up appointment with dermatologist for skin  examination.  Hypertension Hypertension well-controlled with medication. Stable blood pressure readings. - Refill blood pressure medication with a 90-day supply plus one refill.  Hyperlipidemia Hyperlipidemia managed with cholesterol medication. No issues reported. - Refill cholesterol medication with a 90-day supply plus one refill.    Return in about 6 months (around 07/29/2024) for chronic follow up with PCP.   Vinary K Izayiah Tibbitts, MD   HPI   Howard Stafford presents to establish care Discussed the use of AI scribe software for clinical note transcription with the patient, who gave verbal consent to proceed.  History of Present Illness Howard Stafford is a 78 year old male who presents to establish care.  He recently refilled his blood pressure medication for a 90-day supply.  He has a history of skin cancer, including melanoma and basal cell carcinoma, and has undergone multiple Mohs surgeries. He has not seen a dermatologist in the past couple of years due to other medical appointments and treatments.  He is a smoker and has not had a colonoscopy, expressing a preference to avoid it due to his age and prostate issues. He has declined the flu and shingles vaccines.    Outpatient Encounter Medications as of 01/29/2024  Medication Sig   aspirin  EC 81 MG tablet Take 1 tablet (81 mg total) by mouth daily.   clopidogrel  (PLAVIX ) 75 MG tablet Take 1 tablet (75 mg total) by mouth daily.   darolutamide  (NUBEQA ) 300 MG tablet Take 2 tablets (600 mg total) by mouth 2 (two) times daily with a meal.   leuprolide , 6 Month, (ELIGARD ) 45 MG injection Inject 45 mg into the skin every 6 (six) months.   [  DISCONTINUED] atorvastatin  (LIPITOR) 40 MG tablet Take 40 mg by mouth daily.   [DISCONTINUED] clopidogrel  (PLAVIX ) 75 MG tablet Take 1 tablet (75 mg total) by mouth daily.   [DISCONTINUED] lisinopril  (ZESTRIL ) 10 MG tablet Take 10 mg by mouth daily.   atorvastatin  (LIPITOR) 40 MG tablet  Take 1 tablet (40 mg total) by mouth daily.   lisinopril  (ZESTRIL ) 10 MG tablet Take 1 tablet (10 mg total) by mouth daily.   No facility-administered encounter medications on file as of 01/29/2024.      Review of Systems  All other systems reviewed and are negative.       Objective    BP 124/68   Pulse 81   Ht 5' 10 (1.778 m)   Wt 193 lb (87.5 kg)   SpO2 98%   BMI 27.69 kg/m   Physical Exam Vitals and nursing note reviewed.  Constitutional:      Appearance: Normal appearance.  HENT:     Head: Normocephalic.     Right Ear: External ear normal.     Left Ear: External ear normal.  Eyes:     Conjunctiva/sclera: Conjunctivae normal.  Cardiovascular:     Rate and Rhythm: Normal rate.  Pulmonary:     Effort: Pulmonary effort is normal. No respiratory distress.  Abdominal:     Palpations: Abdomen is soft.  Musculoskeletal:        General: Normal range of motion.  Skin:    General: Skin is warm.  Neurological:     Mental Status: He is alert and oriented to person, place, and time.  Psychiatric:        Mood and Affect: Mood normal.

## 2024-01-30 ENCOUNTER — Ambulatory Visit: Admitting: Urology

## 2024-01-31 ENCOUNTER — Ambulatory Visit (INDEPENDENT_AMBULATORY_CARE_PROVIDER_SITE_OTHER): Admitting: Urology

## 2024-01-31 ENCOUNTER — Encounter: Payer: Self-pay | Admitting: Urology

## 2024-01-31 VITALS — BP 114/79 | HR 73 | Ht 70.0 in | Wt 192.4 lb

## 2024-01-31 DIAGNOSIS — R7989 Other specified abnormal findings of blood chemistry: Secondary | ICD-10-CM

## 2024-01-31 DIAGNOSIS — E291 Testicular hypofunction: Secondary | ICD-10-CM | POA: Diagnosis not present

## 2024-01-31 DIAGNOSIS — C61 Malignant neoplasm of prostate: Secondary | ICD-10-CM

## 2024-01-31 MED ORDER — LEUPROLIDE ACETATE (6 MONTH) 45 MG ~~LOC~~ KIT
45.0000 mg | PACK | Freq: Once | SUBCUTANEOUS | Status: AC
  Administered 2024-01-31: 45 mg via SUBCUTANEOUS

## 2024-01-31 MED ORDER — NUBEQA 300 MG PO TABS: 600.0000 mg | ORAL_TABLET | Freq: Two times a day (BID) | ORAL | 3 refills | Status: DC

## 2024-01-31 NOTE — Progress Notes (Signed)
 Eligard  SubQ Injection   Due to Prostate Cancer patient is present today for a Eligard  Injection.  Medication: Eligard  6 month Dose: 45 mg  Location: left  Lot: [id number] Exp: 02-2025  Patient tolerated well, no complications were noted  Performed by: Myrtha Rubinstein, CMA  Per Dr. Georganne patient is to continue therapy for 6 months . Patient's next follow up was scheduled for 07/09/24. This appointment was scheduled using wheel and given to patient today along with reminder continue on Vitamin D 800-1000iu and Calcium  1000-1200mg  daily while on Androgen Deprivation Therapy.  PA approval dates:

## 2024-02-03 ENCOUNTER — Other Ambulatory Visit: Payer: Self-pay

## 2024-02-03 DIAGNOSIS — C61 Malignant neoplasm of prostate: Secondary | ICD-10-CM

## 2024-02-03 MED ORDER — NUBEQA 300 MG PO TABS: 600.0000 mg | ORAL_TABLET | Freq: Two times a day (BID) | ORAL | 3 refills | Status: DC

## 2024-02-03 NOTE — Addendum Note (Signed)
 Addended by: GENITA HARLENE CROME on: 02/03/2024 11:48 AM   Modules accepted: Orders

## 2024-02-03 NOTE — Telephone Encounter (Signed)
 This encounter was created in error - please disregard.

## 2024-02-06 ENCOUNTER — Ambulatory Visit: Payer: Self-pay | Admitting: Family Medicine

## 2024-02-06 LAB — COMPREHENSIVE METABOLIC PANEL WITH GFR
ALT: 19 IU/L (ref 0–44)
AST: 23 IU/L (ref 0–40)
Albumin: 4.3 g/dL (ref 3.8–4.8)
Alkaline Phosphatase: 143 IU/L — ABNORMAL HIGH (ref 47–123)
BUN/Creatinine Ratio: 16 (ref 10–24)
BUN: 16 mg/dL (ref 8–27)
Bilirubin Total: 0.3 mg/dL (ref 0.0–1.2)
CO2: 19 mmol/L — ABNORMAL LOW (ref 20–29)
Calcium: 9.2 mg/dL (ref 8.6–10.2)
Chloride: 102 mmol/L (ref 96–106)
Creatinine, Ser: 1.03 mg/dL (ref 0.76–1.27)
Globulin, Total: 2.8 g/dL (ref 1.5–4.5)
Glucose: 98 mg/dL (ref 70–99)
Potassium: 4.7 mmol/L (ref 3.5–5.2)
Sodium: 136 mmol/L (ref 134–144)
Total Protein: 7.1 g/dL (ref 6.0–8.5)
eGFR: 75 mL/min/1.73 (ref >59)

## 2024-02-06 LAB — CBC WITH DIFFERENTIAL/PLATELET
Basophils Absolute: 0 x10E3/uL (ref 0.0–0.2)
Basos: 1 %
EOS (ABSOLUTE): 0.2 x10E3/uL (ref 0.0–0.4)
Eos: 4 %
Hematocrit: 40.4 % (ref 37.5–51.0)
Hemoglobin: 13.3 g/dL (ref 13.0–17.7)
Immature Grans (Abs): 0 x10E3/uL (ref 0.0–0.1)
Immature Granulocytes: 0 %
Lymphocytes Absolute: 1.4 x10E3/uL (ref 0.7–3.1)
Lymphs: 24 %
MCH: 31.1 pg (ref 26.6–33.0)
MCHC: 32.9 g/dL (ref 31.5–35.7)
MCV: 95 fL (ref 79–97)
Monocytes Absolute: 0.9 x10E3/uL (ref 0.1–0.9)
Monocytes: 15 %
Neutrophils Absolute: 3.2 x10E3/uL (ref 1.4–7.0)
Neutrophils: 56 %
Platelets: 268 x10E3/uL (ref 150–450)
RBC: 4.27 x10E6/uL (ref 4.14–5.80)
RDW: 12.8 % (ref 11.6–15.4)
WBC: 5.7 x10E3/uL (ref 3.4–10.8)

## 2024-02-06 LAB — LIPID PANEL
Chol/HDL Ratio: 4.2 ratio (ref 0.0–5.0)
Cholesterol, Total: 138 mg/dL (ref 100–199)
HDL: 33 mg/dL — ABNORMAL LOW
LDL Chol Calc (NIH): 78 mg/dL (ref 0–99)
Triglycerides: 152 mg/dL — ABNORMAL HIGH (ref 0–149)
VLDL Cholesterol Cal: 27 mg/dL (ref 5–40)

## 2024-02-06 LAB — HEMOGLOBIN A1C
Est. average glucose Bld gHb Est-mCnc: 120 mg/dL
Hgb A1c MFr Bld: 5.8 % — ABNORMAL HIGH (ref 4.8–5.6)

## 2024-02-07 MED ORDER — NUBEQA 300 MG PO TABS: 600.0000 mg | ORAL_TABLET | Freq: Two times a day (BID) | ORAL | 3 refills | Status: DC

## 2024-02-07 NOTE — Addendum Note (Signed)
 Addended by: GENITA HARLENE CROME on: 02/07/2024 08:13 AM   Modules accepted: Orders

## 2024-02-17 ENCOUNTER — Other Ambulatory Visit

## 2024-02-17 DIAGNOSIS — R7989 Other specified abnormal findings of blood chemistry: Secondary | ICD-10-CM

## 2024-02-17 DIAGNOSIS — E291 Testicular hypofunction: Secondary | ICD-10-CM

## 2024-02-18 ENCOUNTER — Other Ambulatory Visit: Payer: Self-pay | Admitting: *Deleted

## 2024-02-18 DIAGNOSIS — C61 Malignant neoplasm of prostate: Secondary | ICD-10-CM

## 2024-02-18 LAB — TESTOSTERONE: Testosterone: 16 ng/dL — ABNORMAL LOW (ref 264–916)

## 2024-02-26 ENCOUNTER — Inpatient Hospital Stay: Payer: Medicare Other | Attending: Internal Medicine

## 2024-03-04 ENCOUNTER — Ambulatory Visit
Admission: RE | Admit: 2024-03-04 | Discharge: 2024-03-04 | Disposition: A | Discharge status: Home / Self Care | Payer: Medicare Other | Source: Ambulatory Visit | Attending: Radiation Oncology | Admitting: Radiation Oncology

## 2024-03-04 ENCOUNTER — Encounter: Payer: Self-pay | Admitting: Radiation Oncology

## 2024-03-04 VITALS — BP 146/76 | HR 65 | Temp 97.3°F | Resp 16 | Ht 70.0 in | Wt 195.2 lb

## 2024-03-04 DIAGNOSIS — Z923 Personal history of irradiation: Secondary | ICD-10-CM | POA: Diagnosis not present

## 2024-03-04 DIAGNOSIS — C61 Malignant neoplasm of prostate: Secondary | ICD-10-CM | POA: Diagnosis present

## 2024-03-04 NOTE — Progress Notes (Signed)
 Radiation Oncology Follow up Note  Name: Howard Stafford   Date:   03/04/2024 MRN:  [id number] DOB: 1945/05/27    This 78 y.o. 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  REFERRING PROVIDER: Corlis Honor BROCKS, MD  HPI: Patient is a 78 year old male now out 28 months having completed image guided IMRT radiation therapy for stage IVa Gleason 7 adenocarcinoma prostate presenting with a PSA in the 90 range.  Seen today in routine follow-up he is doing well specifically denies any increased lower urinary tract symptoms diarrhea or fatigue.  His most recent PSA is less than 0.1 done in September.  He is being followed by urology and continues on ADT therapy..  COMPLICATIONS OF TREATMENT: none  FOLLOW UP COMPLIANCE: keeps appointments   PHYSICAL EXAM:  BP (!) 146/76   Pulse 65   Temp (!) 97.3 F (36.3 C) (Tympanic)   Resp 16   Ht 5' 10 (1.778 m)   Wt 195 lb 3.2 oz (88.5 kg)   BMI 28.01 kg/m  Well-developed well-nourished patient in NAD. HEENT reveals PERLA, EOMI, discs not visualized.  Oral cavity is clear. No oral mucosal lesions are identified. Neck is clear without evidence of cervical or supraclavicular adenopathy. Lungs are clear to A&P. Cardiac examination is essentially unremarkable with regular rate and rhythm without murmur rub or thrill. Abdomen is benign with no organomegaly or masses noted. Motor sensory and DTR levels are equal and symmetric in the upper and lower extremities. Cranial nerves II through XII are grossly intact. Proprioception is intact. No peripheral adenopathy or edema is identified. No motor or sensory levels are noted. Crude visual fields are within normal range.  RADIOLOGY RESULTS: No current films for review  PLAN: Present time patient is doing well with excellent biochemical control of his prostate cancer.  I am going to  turn follow-up care over to urology since they are treating him with ADT therapy and closely following his PSA.  I would be happy to reevaluate the patient anytime should that be indicated.  Patient knows to call with any concerns.  I would like to take this opportunity to thank you for allowing me to participate in the care of your patient.SABRA Marcey Penton, MD

## 2024-03-09 IMAGING — MR MR THORACIC SPINE WO/W CM
5 of 9 series · 26 of 48 positions shown · IV contrast (gadavist)
Comparison: PET-CT 06/28/2021.

CLINICAL DATA: 75-year-old male with prostate cancer. Abnormal T7
vertebral activity on PET-CT

EXAM:
MRI THORACIC WITHOUT AND WITH CONTRAST
TECHNIQUE: Multiplanar and multiecho pulse sequences of the thoracic spine were
obtained without and with intravenous contrast.
CONTRAST:  7.5mL GADAVIST GADOBUTROL 1 MMOL/ML IV SOLN

[Series 18: T1 · sagittal · 6.0mm · 1.41mm/px · 3 of 9 slices shown (1 of 3)]
[im 1/9]
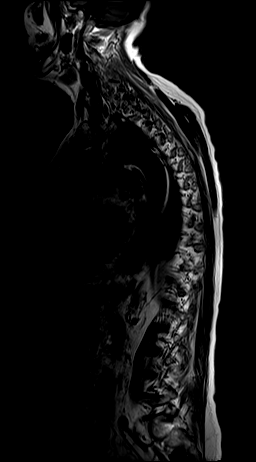
[im 5/9]
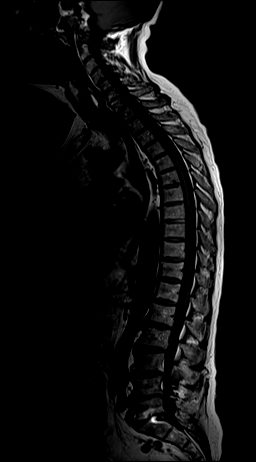
[im 9/9]
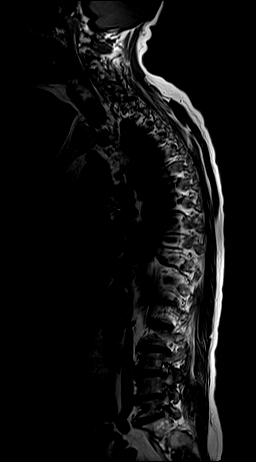

[Series 19: T2 · sagittal · 3.0mm · 1.06mm/px · 4 of 19 slices shown (1 of 2)]
[im 1/19]
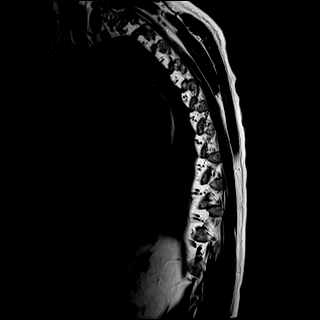
[im 7/19]
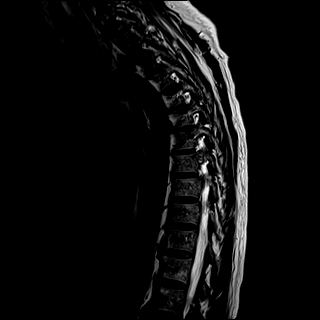
[im 13/19]
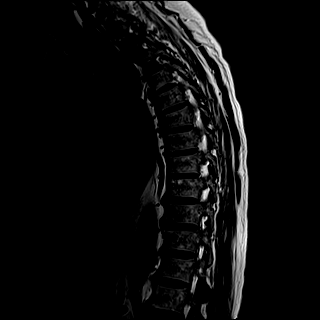
[im 19/19]
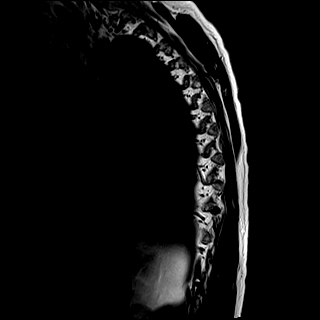

[Series 20: T1 · sagittal · 3.0mm · 1.06mm/px · 3 of 19 slices shown (2 of 3)]
[im 1/19]
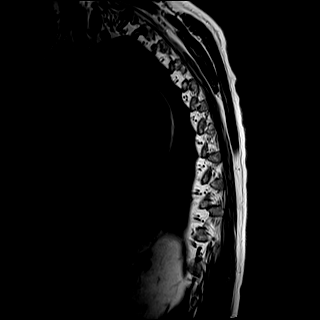
[im 10/19]
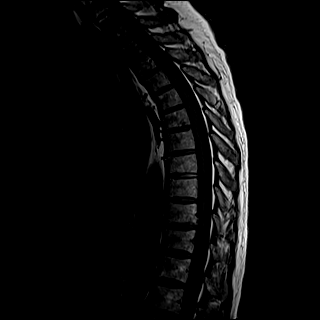
[im 19/19]
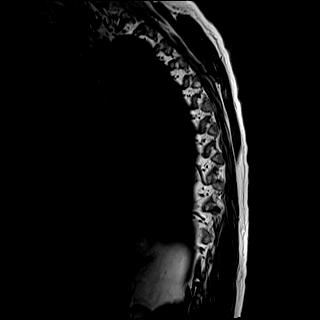

[Series 22: T2 · axial · 4.0mm · 0.59mm/px · z∈[-293,-72]mm · 8 of 44 slices shown (2 of 2)]
[im 1/44]
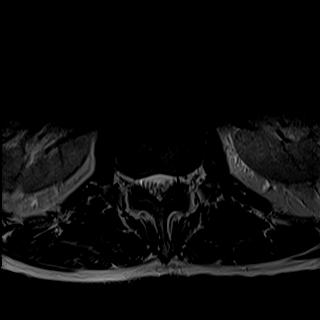
[im 7/44]
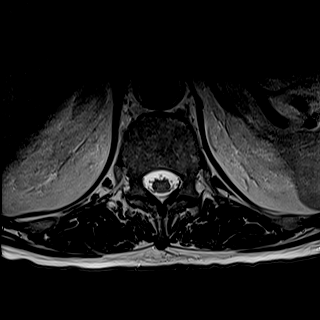
[im 13/44]
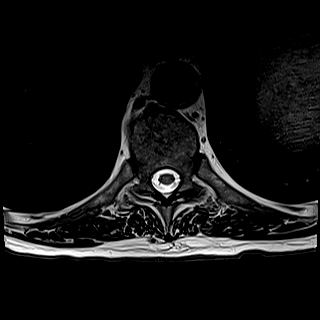
[im 19/44]
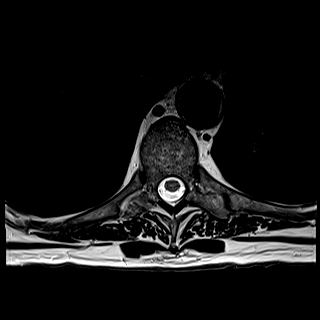
[im 25/44]
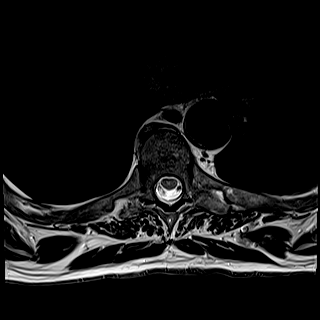
[im 31/44]
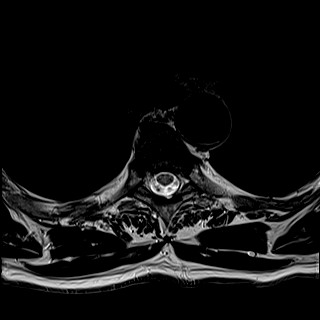
[im 37/44]
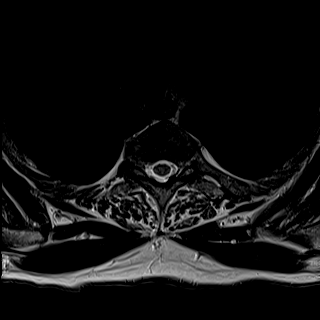
[im 44/44]
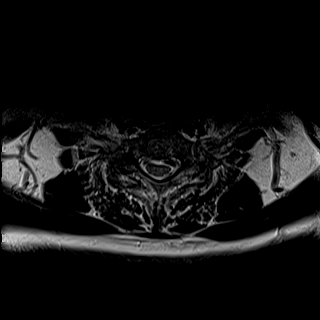

[Series 24: T1 · axial · non-contrast · 4.0mm · 0.31mm/px · z∈[-293,-72]mm · 8 of 44 slices shown (3 of 3)]
[im 1/44]
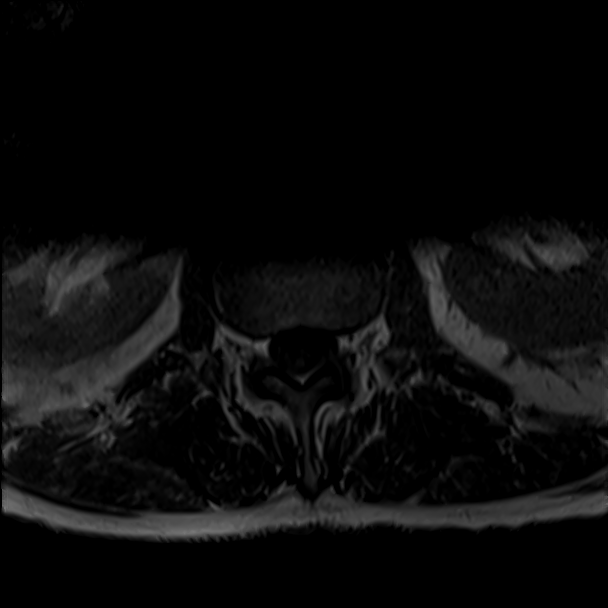
[im 7/44]
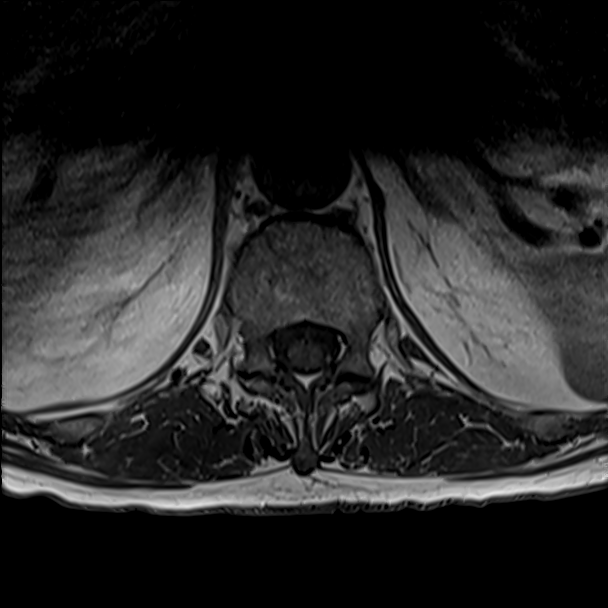
[im 13/44]
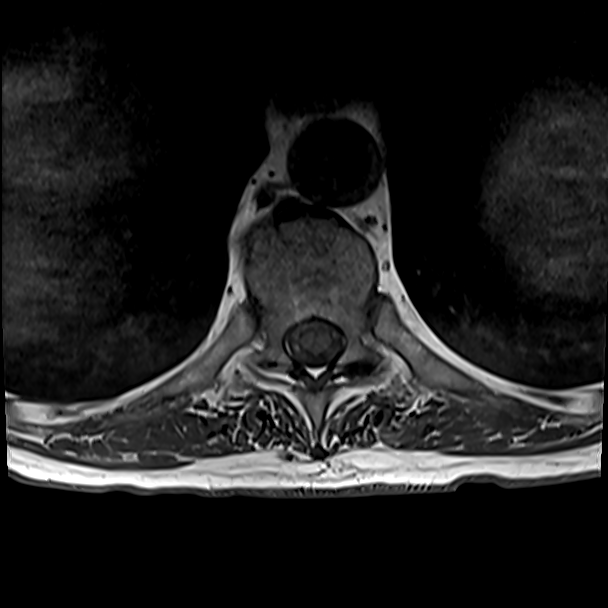
[im 19/44]
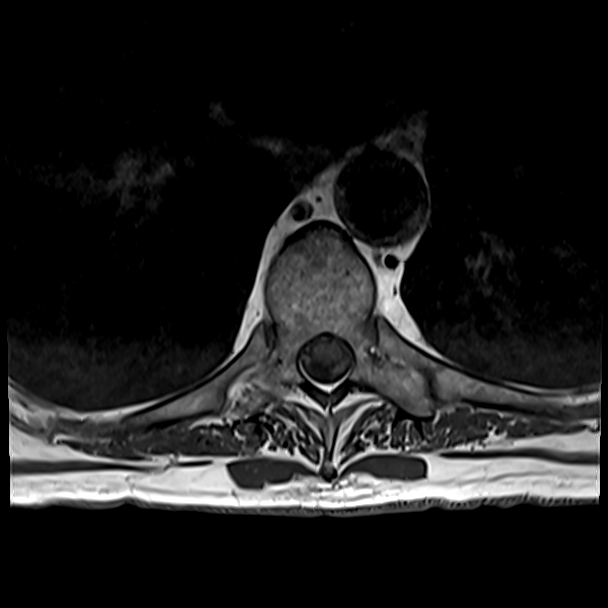
[im 25/44]
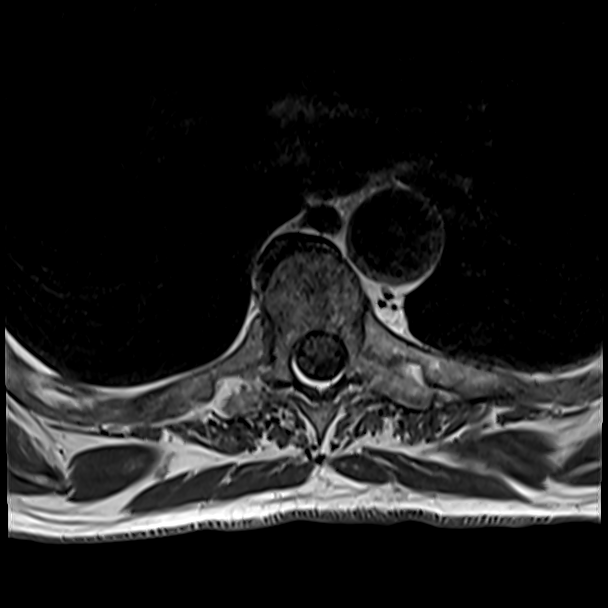
[im 31/44]
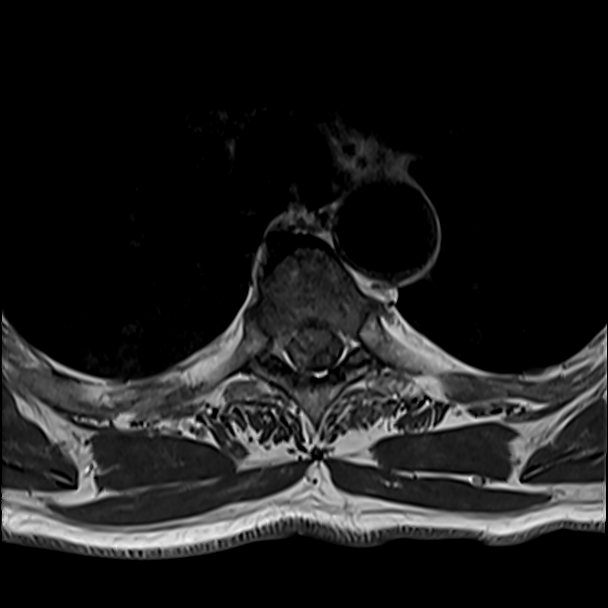
[im 37/44]
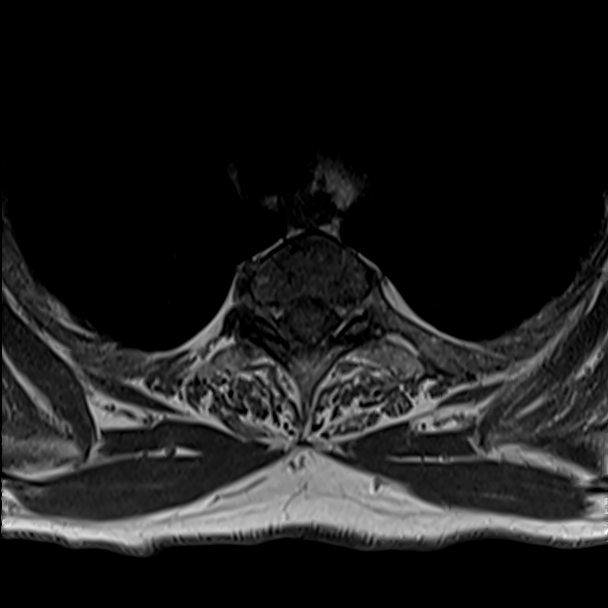
[im 44/44]
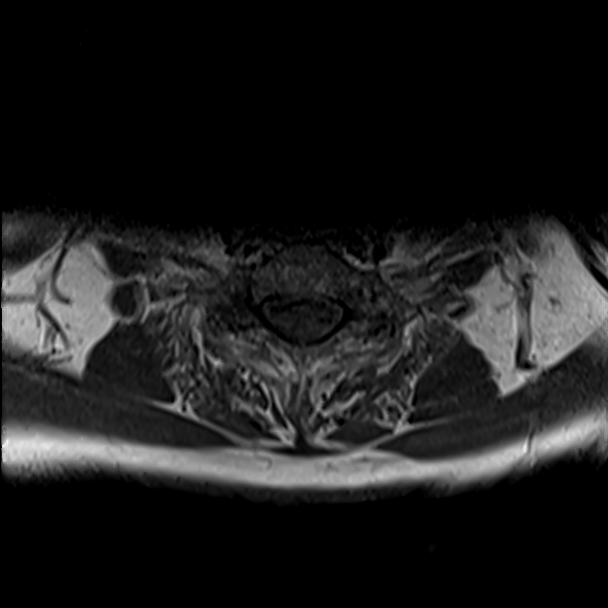

[26 of 48 positions shown; findings below may reference images not displayed]

FINDINGS: Limited cervical spine imaging: C5-C6 and C6-C7 chronic disc and
endplate degeneration with possible mild degenerative spinal
stenosis there.

Thoracic spine segmentation: Appears to be normal. Although the
lesion identified on the comparison is in the T6 inferior endplate
(not T7 as reported, confirmed on series 606 of that exam).

Alignment: Mildly exaggerated thoracic kyphosis. Subtle degenerative
anterolisthesis of T3 on T4.

Vertebrae: Benign L1 vertebral body hemangioma. Background bone
marrow signal is within normal limits.

T6 inferior endplate left of midline small roughly 7 mm round lesion
(series 20, image 11, series 19, image 11) which does most resemble
a Schmorl's node on axial images (series 24, image 20) is associated
with patchy acute marrow edema and enhancement (series 21 and series
26, image 11, respectively).

Elsewhere only faint anterior endplate degenerative marrow edema at
T11 and T12. No other marrow edema or abnormal vertebral
enhancement. No suspicious osseous lesion identified.

Cord: Normal. Capacious spinal canal at most levels. Normal conus
medullaris. No abnormal intradural enhancement or dural thickening.

Paraspinal and other soft tissues: Negative.

Disc levels:

No age advanced degenerative changes in the thoracic spine. No
thoracic spinal stenosis. Notable degenerative findings:

Moderate facet degeneration with hypertrophy at T3-T4 does not
result in spinal stenosis, but mild to moderate right T3 neural
foraminal stenosis.

T10-T11 small right paracentral disc protrusion with mild endplate
spurring. Mild right side facet hypertrophy. Mild to moderate right
T10 neural foraminal stenosis.
IMPRESSION: 1. Thoracic vertebral body lesion seen on PET is clarified to be in
the T6 inferior endplate (not T7). It does most resemble a
degenerative Schmorl's node by MRI. A repeat Thoracic MRI
(noncontrast probably would suffice) in 6 months can be obtained to
confirm stability.

2. No metastatic disease identified in the thoracic spine. And
generally mild for age thoracic spine degeneration with no spinal
stenosis.

## 2024-03-19 ENCOUNTER — Other Ambulatory Visit: Payer: Self-pay

## 2024-03-19 ENCOUNTER — Telehealth: Payer: Self-pay

## 2024-03-19 DIAGNOSIS — C61 Malignant neoplasm of prostate: Secondary | ICD-10-CM

## 2024-03-19 MED ORDER — NUBEQA 300 MG PO TABS: 600.0000 mg | ORAL_TABLET | Freq: Two times a day (BID) | ORAL | 3 refills | Status: AC

## 2024-03-19 NOTE — Telephone Encounter (Signed)
 Received [employer] for new prescription for pt, refilled that medication and gat Dr.Garren sign the Erx order.-Howard Stafford,CMA

## 2024-05-19 ENCOUNTER — Telehealth: Payer: Self-pay

## 2024-05-19 NOTE — Telephone Encounter (Signed)
 Pt's wife LVM on the triage line asking if Nubeqa  was sent to [facility]. Unable to Lvm.

## 2024-06-02 ENCOUNTER — Other Ambulatory Visit: Payer: Self-pay

## 2024-06-02 DIAGNOSIS — C61 Malignant neoplasm of prostate: Secondary | ICD-10-CM

## 2024-06-02 MED ORDER — NUBEQA 300 MG PO TABS: 600.0000 mg | ORAL_TABLET | Freq: Two times a day (BID) | ORAL | 0 refills | Status: AC

## 2024-06-03 ENCOUNTER — Other Ambulatory Visit: Payer: Self-pay

## 2024-06-03 ENCOUNTER — Telehealth: Payer: Self-pay

## 2024-06-03 DIAGNOSIS — E782 Mixed hyperlipidemia: Secondary | ICD-10-CM

## 2024-06-03 DIAGNOSIS — C61 Malignant neoplasm of prostate: Secondary | ICD-10-CM

## 2024-06-03 MED ORDER — ATORVASTATIN CALCIUM 40 MG PO TABS: 40.0000 mg | ORAL_TABLET | Freq: Every day | ORAL | 1 refills | Status: AC

## 2024-06-03 NOTE — Telephone Encounter (Signed)
 Refill sent

## 2024-06-03 NOTE — Telephone Encounter (Signed)
 Okay to refill.

## 2024-06-03 NOTE — Telephone Encounter (Signed)
 Copied from CRM [id number]. Topic: Clinical - Medication Refill >> Jun 03, 2024 10:20 AM Howard Stafford wrote: Medication: atorvastatin  (LIPITOR) 40 MG tablet   lisinopril  (ZESTRIL ) 10 MG tablet   Has the patient contacted their pharmacy? Yes (Agent: If no, request that the patient contact the pharmacy for the refill. If patient does not wish to contact the pharmacy document the reason why and proceed with request.) (Agent: If yes, when and what did the pharmacy advise?)  This is the patient's preferred pharmacy:  [facility] [address], [place] - [name] [place] [address] Phone: [phone] Fax: [phone]    Is this the correct pharmacy for this prescription? Yes If no, delete pharmacy and type the correct one.   Has the prescription been filled recently? No  Is the patient out of the medication? Yes, has over a week left.   Has the patient been seen for an appointment in the last year OR does the patient have an upcoming appointment? Yes  Can we respond through MyChart? Yes  Agent: Please be advised that Rx refills may take up to 3 business days. We ask that you follow-up with your pharmacy.

## 2024-06-03 NOTE — Telephone Encounter (Signed)
 Pts wife LM on triage line requesting a refill of nubeqa  sent to [facility].   Pts wife aware Nya printed rx and is waiting on [facility] to sign to fax over.   Per Todd at [employer] can give a verbal at [phone] Or fax to [phone].  Can also fax to [phone]. Research scientist (medical).  Can also erx to cover my meds pharmacy [place] .   Per Myrtha rx has been faxed. Wife aware to call back if she needs anything further.

## 2024-06-30 ENCOUNTER — Other Ambulatory Visit

## 2024-07-09 ENCOUNTER — Ambulatory Visit: Admitting: Urology

## 2024-07-27 ENCOUNTER — Other Ambulatory Visit

## 2024-07-29 ENCOUNTER — Ambulatory Visit: Admitting: Family Medicine

## 2024-12-04 ENCOUNTER — Ambulatory Visit (INDEPENDENT_AMBULATORY_CARE_PROVIDER_SITE_OTHER): Admitting: Nurse Practitioner

## 2024-12-04 ENCOUNTER — Encounter (INDEPENDENT_AMBULATORY_CARE_PROVIDER_SITE_OTHER)
